# Patient Record
Sex: Female | Born: 1970 | Race: Black or African American | Hispanic: No | Marital: Single | Smoking: Former smoker
Health system: Southern US, Community
[De-identification: ages and names within clinical notes are randomized; demographics above are authoritative.]

## PROBLEM LIST (undated history)

## (undated) DIAGNOSIS — E119 Type 2 diabetes mellitus without complications: Secondary | ICD-10-CM

## (undated) DIAGNOSIS — I10 Essential (primary) hypertension: Secondary | ICD-10-CM

## (undated) HISTORY — DX: Type 2 diabetes mellitus without complications: E11.9

## (undated) HISTORY — PX: CHOLECYSTECTOMY: SHX55

## (undated) HISTORY — PX: ABDOMINAL HYSTERECTOMY: SHX81

---

## 1997-08-18 ENCOUNTER — Encounter: Admission: RE | Admit: 1997-08-18 | Discharge: 1997-11-16 | Payer: Self-pay | Admitting: *Deleted

## 1998-08-25 ENCOUNTER — Other Ambulatory Visit: Admission: RE | Admit: 1998-08-25 | Discharge: 1998-08-25 | Payer: Self-pay | Admitting: *Deleted

## 1998-12-08 ENCOUNTER — Other Ambulatory Visit: Admission: RE | Admit: 1998-12-08 | Discharge: 1998-12-08 | Payer: Self-pay | Admitting: *Deleted

## 2000-11-28 ENCOUNTER — Other Ambulatory Visit: Admission: RE | Admit: 2000-11-28 | Discharge: 2000-11-28 | Payer: Self-pay | Admitting: *Deleted

## 2001-09-05 ENCOUNTER — Encounter: Payer: Self-pay | Admitting: General Surgery

## 2001-09-05 ENCOUNTER — Encounter (INDEPENDENT_AMBULATORY_CARE_PROVIDER_SITE_OTHER): Payer: Self-pay | Admitting: Specialist

## 2001-09-05 ENCOUNTER — Observation Stay (HOSPITAL_COMMUNITY): Admission: RE | Admit: 2001-09-05 | Discharge: 2001-09-06 | Payer: Self-pay | Admitting: General Surgery

## 2002-07-24 ENCOUNTER — Other Ambulatory Visit: Admission: RE | Admit: 2002-07-24 | Discharge: 2002-07-24 | Payer: Self-pay | Admitting: *Deleted

## 2004-01-09 ENCOUNTER — Ambulatory Visit (HOSPITAL_BASED_OUTPATIENT_CLINIC_OR_DEPARTMENT_OTHER): Admission: RE | Admit: 2004-01-09 | Discharge: 2004-01-09 | Payer: Self-pay | Admitting: Internal Medicine

## 2004-02-02 ENCOUNTER — Emergency Department (HOSPITAL_COMMUNITY): Admission: EM | Admit: 2004-02-02 | Discharge: 2004-02-02 | Payer: Self-pay | Admitting: Family Medicine

## 2004-02-22 ENCOUNTER — Other Ambulatory Visit: Admission: RE | Admit: 2004-02-22 | Discharge: 2004-02-22 | Payer: Self-pay | Admitting: Obstetrics and Gynecology

## 2004-06-12 ENCOUNTER — Emergency Department (HOSPITAL_COMMUNITY): Admission: EM | Admit: 2004-06-12 | Discharge: 2004-06-12 | Payer: Self-pay | Admitting: Family Medicine

## 2005-04-06 ENCOUNTER — Encounter: Admission: RE | Admit: 2005-04-06 | Discharge: 2005-04-06 | Payer: Self-pay | Admitting: Internal Medicine

## 2006-03-01 ENCOUNTER — Emergency Department (HOSPITAL_COMMUNITY): Admission: EM | Admit: 2006-03-01 | Discharge: 2006-03-01 | Payer: Self-pay | Admitting: Emergency Medicine

## 2006-03-31 ENCOUNTER — Emergency Department (HOSPITAL_COMMUNITY): Admission: EM | Admit: 2006-03-31 | Discharge: 2006-03-31 | Payer: Self-pay | Admitting: Family Medicine

## 2006-03-31 ENCOUNTER — Emergency Department (HOSPITAL_COMMUNITY): Admission: EM | Admit: 2006-03-31 | Discharge: 2006-04-01 | Payer: Self-pay | Admitting: Emergency Medicine

## 2006-07-30 ENCOUNTER — Inpatient Hospital Stay (HOSPITAL_COMMUNITY): Admission: RE | Admit: 2006-07-30 | Discharge: 2006-08-02 | Payer: Self-pay | Admitting: Obstetrics and Gynecology

## 2006-07-30 ENCOUNTER — Encounter (INDEPENDENT_AMBULATORY_CARE_PROVIDER_SITE_OTHER): Payer: Self-pay | Admitting: Specialist

## 2009-02-02 ENCOUNTER — Inpatient Hospital Stay (HOSPITAL_COMMUNITY): Admission: RE | Admit: 2009-02-02 | Discharge: 2009-02-05 | Payer: Self-pay | Admitting: Obstetrics and Gynecology

## 2009-02-02 ENCOUNTER — Encounter (INDEPENDENT_AMBULATORY_CARE_PROVIDER_SITE_OTHER): Payer: Self-pay | Admitting: Obstetrics and Gynecology

## 2009-02-15 ENCOUNTER — Encounter: Admission: RE | Admit: 2009-02-15 | Discharge: 2009-02-15 | Payer: Self-pay | Admitting: Obstetrics and Gynecology

## 2009-03-22 ENCOUNTER — Ambulatory Visit: Payer: Self-pay | Admitting: Oncology

## 2009-03-24 LAB — IRON AND TIBC
Iron: 24 ug/dL — ABNORMAL LOW (ref 42–145)
TIBC: 376 ug/dL (ref 250–470)
UIBC: 352 ug/dL

## 2009-03-24 LAB — LACTATE DEHYDROGENASE: LDH: 143 U/L (ref 94–250)

## 2009-03-24 LAB — CBC WITH DIFFERENTIAL/PLATELET
Basophils Absolute: 0.1 10*3/uL (ref 0.0–0.1)
EOS%: 3.8 % (ref 0.0–7.0)
Eosinophils Absolute: 0.4 10*3/uL (ref 0.0–0.5)
HGB: 11.2 g/dL — ABNORMAL LOW (ref 11.6–15.9)
LYMPH%: 25.2 % (ref 14.0–49.7)
MCH: 20.7 pg — ABNORMAL LOW (ref 25.1–34.0)
MCV: 66.8 fL — ABNORMAL LOW (ref 79.5–101.0)
MONO%: 5.5 % (ref 0.0–14.0)
NEUT#: 6.3 10*3/uL (ref 1.5–6.5)
NEUT%: 64.8 % (ref 38.4–76.8)
Platelets: 258 10*3/uL (ref 145–400)

## 2009-03-24 LAB — COMPREHENSIVE METABOLIC PANEL
BUN: 9 mg/dL (ref 6–23)
CO2: 22 mEq/L (ref 19–32)
Creatinine, Ser: 0.72 mg/dL (ref 0.40–1.20)
Glucose, Bld: 100 mg/dL — ABNORMAL HIGH (ref 70–99)
Total Bilirubin: 0.4 mg/dL (ref 0.3–1.2)
Total Protein: 6.8 g/dL (ref 6.0–8.3)

## 2009-03-24 LAB — FERRITIN: Ferritin: 22 ng/mL (ref 10–291)

## 2009-06-27 ENCOUNTER — Ambulatory Visit: Payer: Self-pay | Admitting: Oncology

## 2009-06-28 ENCOUNTER — Encounter: Admission: RE | Admit: 2009-06-28 | Discharge: 2009-06-28 | Payer: Self-pay | Admitting: Internal Medicine

## 2009-10-24 ENCOUNTER — Encounter: Admission: RE | Admit: 2009-10-24 | Discharge: 2009-12-29 | Payer: Self-pay | Admitting: Internal Medicine

## 2009-12-23 ENCOUNTER — Emergency Department (HOSPITAL_COMMUNITY): Admission: EM | Admit: 2009-12-23 | Discharge: 2009-12-23 | Payer: Self-pay | Admitting: Family Medicine

## 2010-07-05 LAB — CBC
HCT: 26.8 % — ABNORMAL LOW (ref 36.0–46.0)
HCT: 29.7 % — ABNORMAL LOW (ref 36.0–46.0)
HCT: 34.8 % — ABNORMAL LOW (ref 36.0–46.0)
HCT: 37.8 % (ref 36.0–46.0)
Hemoglobin: 7.4 g/dL — ABNORMAL LOW (ref 12.0–15.0)
Hemoglobin: 9.4 g/dL — ABNORMAL LOW (ref 12.0–15.0)
MCHC: 29.8 g/dL — ABNORMAL LOW (ref 30.0–36.0)
MCHC: 30.3 g/dL (ref 30.0–36.0)
MCHC: 30.8 g/dL (ref 30.0–36.0)
MCV: 62.2 fL — ABNORMAL LOW (ref 78.0–100.0)
MCV: 62.3 fL — ABNORMAL LOW (ref 78.0–100.0)
MCV: 62.9 fL — ABNORMAL LOW (ref 78.0–100.0)
MCV: 67.5 fL — ABNORMAL LOW (ref 78.0–100.0)
Platelets: 207 10*3/uL (ref 150–400)
Platelets: 288 10*3/uL (ref 150–400)
RBC: 3.88 MIL/uL (ref 3.87–5.11)
RBC: 3.97 MIL/uL (ref 3.87–5.11)
RBC: 4.28 MIL/uL (ref 3.87–5.11)
RBC: 5.54 MIL/uL — ABNORMAL HIGH (ref 3.87–5.11)
RBC: 6.07 MIL/uL — ABNORMAL HIGH (ref 3.87–5.11)
RDW: 22.3 % — ABNORMAL HIGH (ref 11.5–15.5)
WBC: 12.4 10*3/uL — ABNORMAL HIGH (ref 4.0–10.5)
WBC: 13.3 10*3/uL — ABNORMAL HIGH (ref 4.0–10.5)

## 2010-07-05 LAB — TYPE AND SCREEN: ABO/RH(D): O POS

## 2010-07-05 LAB — BASIC METABOLIC PANEL
CO2: 24 mEq/L (ref 19–32)
CO2: 26 mEq/L (ref 19–32)
Calcium: 7.7 mg/dL — ABNORMAL LOW (ref 8.4–10.5)
Chloride: 110 mEq/L (ref 96–112)
Creatinine, Ser: 0.59 mg/dL (ref 0.4–1.2)
Creatinine, Ser: 0.96 mg/dL (ref 0.4–1.2)
GFR calc Af Amer: >60 mL/min (ref >60)
GFR calc Af Amer: >60 mL/min (ref >60)
GFR calc non Af Amer: >60 mL/min (ref >60)
Potassium: 3.7 mEq/L (ref 3.5–5.1)
Sodium: 139 mEq/L (ref 135–145)

## 2010-07-05 LAB — ABO/RH: ABO/RH(D): O POS

## 2010-07-05 LAB — HEMOGLOBIN AND HEMATOCRIT, BLOOD: Hemoglobin: 9.3 g/dL — ABNORMAL LOW (ref 12.0–15.0)

## 2010-07-05 LAB — PREPARE RBC (CROSSMATCH)

## 2010-07-05 LAB — PREGNANCY, URINE: Preg Test, Ur: NEGATIVE

## 2010-07-05 LAB — FOLLICLE STIMULATING HORMONE: FSH: 5.4 m[IU]/mL

## 2010-08-18 NOTE — Discharge Summary (Signed)
[name], Jaime Daugherty              ACCOUNT NO.:  [id number]   MEDICAL RECORD NO.:  [id number]          PATIENT TYPE:  INP   LOCATION:  9319                          FACILITY:  [facility]   PHYSICIAN:  Jaime Monday, MD        DATE OF BIRTH:  Dec 09, 1970   DATE OF ADMISSION:  07/30/2006  DATE OF DISCHARGE:  08/02/2006                               DISCHARGE SUMMARY   ADMISSION DIAGNOSES:  1. Abnormal uterine bleeding.  2. Pelvic pain.  3. Ovarian masses.   DISCHARGE DIAGNOSES:  1. Abnormal uterine bleeding.  2. Pelvic pain.  3. Ovarian masses.  4. Status post bilateral salpingectomy.  5. Postop fever.   HISTORY OF PRESENT ILLNESS:  A 40 year old African-American female, G0,  who has been followed at the office for abnormal uterine bleeding,  pelvic pain and ovarian masses.  She was doing okay with occasional  pain.  Had an ultrasound on the 25th of April and since then is having  diffuse pain that was not controlled with Vicodin or NSAIDs.  She was  going to be scheduled for hysteroscopy and laparoscopy on May 16, but  due to increased pain would like to proceed as soon as possible.   PAST MEDICAL HISTORY:  Hypertension.   PAST SURGICAL HISTORY:  Cholecystectomy.   ALLERGIES:  NO KNOWN DRUG ALLERGIES.   MEDICATIONS:  1. Lisinopril.  2. Vicodin.   SOCIAL HISTORY:  She is single.  Denies alcohol, tobacco or drug use.   FAMILY HISTORY:  Not applicable.   PHYSICAL EXAMINATION:  VITAL SIGNS:  On admission she is afebrile.  Vital signs stable.  GENERAL:  She is in moderate distress.  HEART;  Regular rate and rhythm.  LUNGS:  Clear to auscultation bilaterally.  ABDOMEN:  Soft, nondistended, nontender in bilateral lower quadrants.  EXTREMITIES:  Without edema.  PELVIC EXAM:  Difficult to palpate due to habitus and tenderness  bilaterally.   HOSPITAL COURSE:  She was admitted and underwent surgery without  difficulty.  Her hemoglobin decreased from 9.8 to 7.7 directly  postoperatively.  She had a fever immediately postoperatively and was  started on Unasyn.  There was further question if her ovarian masseswere  TOAs or Tuboovarian complexes.  Her postoperative course was otherwise  uncomplicated.  On postoperative day #3 she had remained afebrile for  greater 24 hours for at least 12 off antibiotics.  Her hemoglobin was  stable and she was discharged to home.   SURGERIES PERFORMED:  1. Hysteroscopy.  2. D&C.  3. Laparoscopy.  4. Laparotomy.  5. Removal of bilateral adnexal masses.   PLAN:  Plan to remove her staples on Daugherty.   DISCHARGE INSTRUCTIONS:  Routine discharge instructions and prescription  for Motrin and Vicodin .  She voiced understanding to all of this and  was discharged to home.      Jaime Monday, MD  Electronically Signed     JB/MEDQ  D:  08/02/2006  T:  08/02/2006  Job:  [id number]

## 2010-08-18 NOTE — Procedures (Signed)
Jaime Daugherty, Jaime Daugherty              ACCOUNT NO.:  [id number]   MEDICAL RECORD NO.:  [id number]          PATIENT TYPE:  OUT   LOCATION:  SLEEP CENTER                 FACILITY:  [facility]   PHYSICIAN:  Clinton D. Maple Hudson, M.D. DATE OF BIRTH:  1971-02-13   DATE OF STUDY:  01/09/2004                              NOCTURNAL POLYSOMNOGRAM   REFERRING PHYSICIAN:  Ralene Ok, M.D.   INDICATION FOR STUDY:  Hypersomnia with sleep apnea.   SLEEP ARCHITECTURE:  Total sleep time 418 minutes with sleep efficiency 86%,  stage I was 6%, stage II 62%, stages III and IV 13%, REM was 19% of total  sleep time, sleep latency was 12 minutes, REM latency 111 minutes, awake  after sleep onset 54 minutes, arousal index 19.   RESPIRATORY DATA:  Split-study protocol.  RDI 61.9/hr indicating severe  obstructive sleep apnea/hypopnea syndrome before CPAP.  This included 16  obstructive apneas and 117 hypopneas before CPAP.  Events were not  positional.  REM RDI was 15.2.  CPAP was titrated to 11 CWP, RDI 0/hr using  a small ResMed Ultra Mirage Nasal/Oral CPAP Mask.   OXYGEN DATA:  Moderate to loud snoring with oxygen desaturation to a nadir  of 76% before CPAP control.  After CPAP titration saturation held 96% on  room air.   CARDIAC DATA:  Normal sinus rhythm.   MOVEMENT/PARASOMNIA:  Twenty two limb jerks were recorded of which 6 were  associated with arousal or awakening for a periodic limb movement with  arousal index of 0.9/hr which is unlikely to be significant.   IMPRESSION/RECOMMENDATION:  Severe obstructive sleep apnea/hypopnea  syndrome, respiratory disturbance index 61.9/hr with desaturation to 76%.  Successful continuous positive airway pressure titration to 11 CWP,  respiratory disturbance index 0/hr using a small ResMed Ultra Mirage  Nasal/Oral Mask.  A heated humidifier is suggested.                                                           Clinton D. Maple Hudson, M.D.  Diplomate, [employer]   CDY/MEDQ  D:  01/23/2004 08:39:29  T:  01/23/2004 19:22:52  Job:  [id number]

## 2010-08-18 NOTE — Op Note (Signed)
[facility]  Patient:    Jaime, Daugherty Visit Number: [id number] MRN: [id number]          Service Type: SUR Location: 3W 0381 01 Attending Physician:  Henrene Dodge Dictated by:   Anselm Pancoast. Zachery Dakins, M.D. Proc. Date: 09/05/01 Admit Date:  09/05/2001 Discharge Date: 09/06/2001                             Operative Report  PREOPERATIVE DIAGNOSIS:  Chronic cholecystitis with stones.  POSTOPERATIVE DIAGNOSIS:  Chronic cholecystitis with stones.  OPERATION PERFORMED:  Laparoscopic cholecystectomy with cholangiogram.  ANESTHESIA:  General.  SURGEON:  Anselm Pancoast. Zachery Dakins, M.D.  ASSISTANT:  Rose Phi. Maple Hudson, M.D.  HISTORY OF PRESENT ILLNESS:  Jaime Daugherty is a 40 year old overweight black female who presented to our office after being referred for episodes of epigastric pain. The patient was not acutely ill when I saw her but she has had some fairly severe attacks and last Saturday night was the most intense, this occurring after eating fish earlier in the evening. I saw her, she had been seen in the emergency room where they had ordered an x-ray that showed a 2 cm stone in the gallbladder. She lives with her parents and is admitted at this time for elective cholecystectomy. She states she has not had severe pain since I saw her two days earlier.  DESCRIPTION OF PROCEDURE:  The patient preoperatively was given 3 gm of Unasyn and PAS stockings and was taken to the operative suite. Induction of general anesthesia, endotracheal tube, oral tube into the stomach and then the abdomen was prepped with Betadine surgical scrub and solution and draped in a sterile manner. The patients heavy and approximately a small 2 cm incision was made down through approximately 3 inches of adipose tissue, identifying the fascia at the lower umbilicus. This was then picked up between two Kochers and a small opening made. The underlying peritoneum was  identified and then carefully entered through it with a Kelly bluntly. Traction sutures of #0 Vicryl were placed and the Hasson cannula introduced. The upper abdomen showed a large area. The gallbladder was up under the liver but the upper 10 mm trocar was placed and the two lateral 5 mm trocars were placed in the appropriate position after anesthetizing the fascia by Dr. Francina Ames. The gallbladder was retracted upward and outward. It was reasonably tense, not acutely inflamed and then we went into the proximal gallbladder and sort of peeled the peritoneum down and there was an anterior branch of the cystic artery that was kind of peeled off. This was doubly clipped proximally, singly distally and divided. Then we kept kind of stripping this down more proximal getting to where the stone was impacted in the neck of the gallbladder. I was able to milk the stone back and then could kind of encompass the cystic duct junction of the gallbladder and then a clip was placed across at this point. A small opening made and the [facility] catheter was placed, held in place with a clip and then an x-ray was obtained that showed good flow into the extrahepatic biliary system with a very small common bile duct apparently a long cystic duct that I am sure part of its intramural. The catheter was removed and the cystic duct was triply clipped and then divided and the posterior branch of the cystic artery was likewise identified, doubly clipped proximally, singly distally divided and  then the gallbladder carefully removed from its bed with the hook electrocautery. The gallbladder was intact and then was brought up through the camera and the upper 10 mm port and withdrawn through the fascial defect. The fascia did not have to be enlarged to get this stone through and then the fascia was closed, the two sutures previously placed were tied and interrupted figure-of-eight was placed in the midline of #0 Vicryl also.  The wounds were thoroughly irrigated and then closed subcuticularly with 4-0 Vicryl, Benzoin and Steri-Strips on the skin. We removed some of the irrigating fluid with no evidence of any bleeding and we released the carbon dioxide before withdrawing the trocars. The patient will spend the night and she will be ready to be discharged in the a.m. and hopefully will have no further problems of abdominal pain. Dictated by:   Anselm Pancoast. Zachery Dakins, M.D. Attending Physician:  Henrene Dodge DD:  09/05/01 TD:  09/08/01 Job: [id number]

## 2010-08-18 NOTE — Op Note (Signed)
Jaime Daugherty, Jaime Daugherty              ACCOUNT NO.:  [id number]   MEDICAL RECORD NO.:  [id number]          PATIENT TYPE:  INP   LOCATION:  9319                          FACILITY:  [facility]   PHYSICIAN:  Zenaida Niece, M.D.DATE OF BIRTH:  Dec 24, 1970   DATE OF PROCEDURE:  07/30/2006  DATE OF DISCHARGE:                               OPERATIVE REPORT   PREOPERATIVE DIAGNOSIS:  Pelvic pain, adnexal masses, and abnormal  uterine bleeding.   POSTOPERATIVE DIAGNOSIS:  Pelvic pain, adnexal masses, and abnormal  uterine bleeding.   PROCEDURES:  Hysteroscopy with D&C followed by a laparoscopy, followed  by laparotomy with removal of bilateral adnexal masses.   SURGEON:  Zenaida Niece, M.D.   ANESTHESIA:  General endotracheal tube.   SPECIMENS:  Endometrial curettings and bilateral adnexal masses sent to  pathology.   ESTIMATED BLOOD LOSS:  1000 mL.   COMPLICATIONS:  The large adnexal masses with significant adhesions to  the back of the uterus and the pelvic sidewalls.   FINDINGS:  On hysteroscopy I did not get good visualization but did not  see any significant polyps or fibroids.  Curettage revealed minimal  tissue.  On laparoscopy, she had cystic gelatinous material filling the  pelvis obscuring view of normal tubes and ovaries and uterus.  On  laparotomy, she had large bilateral adnexal masses which were  significantly adherent to the back of the uterus and the pelvic  sidewalls.  By cystoscopy, she had a normal bladder with patent ureters.   PROCEDURE IN DETAIL:  The patient was taken to the operating room and  placed in the dorsosupine position.  General anesthesia was induced and  she was placed in mobile stirrups.  Abdomen, perineum and vagina were  then prepped and draped in the usual sterile fashion.  A Graves speculum  was inserted into the vagina and the anterior lip of the cervix was  grasped with a single-tooth tenaculum.  Paracervical block was then  performed with 16  mL of 2% plain lidocaine.  The uterus sounded to  approximately 9 cm.  Cervix was gradually dilated to a size 19 dilator  and the observer hysteroscope was inserted without difficulty.  The  uterus was tilted significantly anterior and was difficult to get good  visualization.  I did not notice any significant polyps or submucosal  fibroids.  Sharp curettage was performed and revealed minimal tissue.  Hysteroscopy was performed again and again no significant lesions were  seen although visualization was suboptimal.  The hysteroscope was  removed.  A Zumi uterine manipulator was then introduced for  manipulation and possible chromopertubation.  The bladder was also  drained with a Foley catheter.  The speculum was removed from the  vagina.  The scrub tech and myself changed gloves and we turned to the  abdomen.  A three-quarter cm vertical incision was made in her umbilicus  at her previous scar.  This was done after this area was infiltrated  with quarter percent Marcaine.  Veress needle was inserted into the  peritoneal cavity and placement confirmed by water drop test and opening  pressure  6 mmHg.  CO2 gas was inflated to a pressure of 12 mmHg and the  Veress needle was removed.  A 5 mm trocar was then introduced with  direct visualization with the laparoscope.  A 5 mm trocar was then also  placed on the left side under direct visualization.  Inspection revealed  the above-mentioned findings.  The upper abdomen appeared normal.  The  appendix was retrocecal and was not well visualized.  I was unable to  significantly visualize any pelvic anatomy due to the large gelatinous  cystic structures that were significantly adherent.  The trocars were  removed and all gas allowed to deflate from the abdomen.   Attention was turned to laparotomy.  The abdomen was then entered via a  standard Pfannenstiel's incision and a self-retaining retractor was  placed.  Small bowel was packed out of the  incision.  The attention was  first turned to the right adnexal mass.  Bluntly this was dissected from  the colon to free up the colon.  This was then bluntly freed up from the  pelvic sidewall with significant difficulty.  I was able to free this  from the ovary clamping with a Heaney clamp and removing the pedicle and  suturing the uterine side of the pedicle.  Then using electrocautery I  was able to free the tube and ovary up to the level of the  infundibulopelvic ligament and this was clamped and transected and  ligated with 0 Vicryl.  The specimen was sent as right adnexal mass.  I  am pretty sure that this was the right tube and ovary.  There was  significant adhesions and edema of the tissue that made it very  difficult to identify what was tube and ovary.  Bleeding from the pelvic  sidewall and the top of the uterus was controlled with electrocautery  and 3-0 Vicryl.  Inspection then revealed a similar mass on the left  side.  In a similar manner using blunt and sharp dissection with  electrocautery this mass was freed up.  Again I started at the utero-  ovarian pedicle and this was clamped, transected and ligated with 0  Vicryl.  Using electrocautery this was freed up to the level of the  infundibulopelvic ligament.  This was then clamped, transected and  ligated with 0 Vicryl.  All pedicles were inspected and the pelvis was  copiously irrigated.  No further masses were identified.  There is still  some significant adhesions in the posterior pelvis but I was unable to  adequately visualize this.  Bleeding from the uterine fundus was  controlled with electrocautery and 3-0 Vicryl.  All pedicles were then  found to be hemostatic.  I was able to palpate the left ureter but not  the right ureter.  All packs were removed from the pelvis.  The  subfascial space was irrigated and made hemostatic with electrocautery. Fascia was closed in running fashion starting at both ends and  meeting  in the middle with 0 Vicryl.  Subcutaneous tissue was irrigated and made  hemostatic with electrocautery.  Subcutaneous tissue was closed with  running 2-0 plain gut suture.  Skin was closed with staples.   Attention was then turned to cystoscopy after the laparoscopic incisions  were closed with Dermabond.  The Foley catheter was removed as well as  the [employer].  A 70 degrees cystoscope was inserted into the  bladder and the bladder filled with sterile solution.  The bladder  appeared normal and indigo carmine was seen to flow freely from each  ureteral orifice.  The cystoscope was removed and the Foley catheter was  replaced.  The patient was then taken down from stirrups.  She was  awakened in the operating room and taken to the recovery room in stable  condition.  Counts were correct and she received Ancef 1 gram IV during  the procedure once laparotomy was made.  She had PAS hose on throughout  the procedure.  Due to her blood loss she had a type and cross performed  and I will check this hemoglobin shortly after surgery to see if she  requires transfusion.      Zenaida Niece, M.D.  Electronically Signed     TDM/MEDQ  D:  07/30/2006  T:  07/31/2006  Job:  [id number]

## 2011-01-16 ENCOUNTER — Inpatient Hospital Stay (INDEPENDENT_AMBULATORY_CARE_PROVIDER_SITE_OTHER)
Admission: RE | Admit: 2011-01-16 | Discharge: 2011-01-16 | Disposition: A | Discharge status: Home / Self Care | Payer: BC Managed Care – PPO | Source: Ambulatory Visit | Attending: Emergency Medicine | Admitting: Emergency Medicine

## 2011-01-16 DIAGNOSIS — S335XXA Sprain of ligaments of lumbar spine, initial encounter: Secondary | ICD-10-CM

## 2011-01-16 LAB — POCT URINALYSIS DIP (DEVICE)
Ketones, ur: NEGATIVE mg/dL
Leukocytes, UA: NEGATIVE
Protein, ur: 30 mg/dL — AB
Urobilinogen, UA: 0.2 mg/dL (ref 0.0–1.0)
pH: 7 (ref 5.0–8.0)

## 2011-05-09 ENCOUNTER — Ambulatory Visit
Admission: RE | Admit: 2011-05-09 | Discharge: 2011-05-09 | Disposition: A | Discharge status: Home / Self Care | Payer: BC Managed Care – PPO | Source: Ambulatory Visit | Attending: Internal Medicine | Admitting: Internal Medicine

## 2011-05-09 ENCOUNTER — Other Ambulatory Visit: Payer: Self-pay | Admitting: Internal Medicine

## 2011-05-09 DIAGNOSIS — M545 Low back pain, unspecified: Secondary | ICD-10-CM

## 2011-06-04 ENCOUNTER — Other Ambulatory Visit: Payer: Self-pay | Admitting: Obstetrics and Gynecology

## 2011-06-04 DIAGNOSIS — Z1231 Encounter for screening mammogram for malignant neoplasm of breast: Secondary | ICD-10-CM

## 2011-06-12 ENCOUNTER — Ambulatory Visit
Admission: RE | Admit: 2011-06-12 | Discharge: 2011-06-12 | Disposition: A | Discharge status: Home / Self Care | Payer: BC Managed Care – PPO | Source: Ambulatory Visit | Attending: Obstetrics and Gynecology | Admitting: Obstetrics and Gynecology

## 2011-06-12 DIAGNOSIS — Z1231 Encounter for screening mammogram for malignant neoplasm of breast: Secondary | ICD-10-CM

## 2011-06-25 ENCOUNTER — Ambulatory Visit (INDEPENDENT_AMBULATORY_CARE_PROVIDER_SITE_OTHER): Payer: BC Managed Care – PPO | Admitting: Family Medicine

## 2011-06-25 VITALS — BP 123/80 | HR 88 | Temp 98.6°F | Resp 18 | Ht 65.0 in | Wt 284.0 lb

## 2011-06-25 DIAGNOSIS — R5383 Other fatigue: Secondary | ICD-10-CM

## 2011-06-25 DIAGNOSIS — I10 Essential (primary) hypertension: Secondary | ICD-10-CM

## 2011-06-25 DIAGNOSIS — J111 Influenza due to unidentified influenza virus with other respiratory manifestations: Secondary | ICD-10-CM

## 2011-06-25 DIAGNOSIS — R6883 Chills (without fever): Secondary | ICD-10-CM

## 2011-06-25 LAB — POCT INFLUENZA A/B: Influenza A, POC: POSITIVE

## 2011-06-25 MED ORDER — HYDROCODONE-HOMATROPINE 5-1.5 MG/5ML PO SYRP: 5.0000 mL | ORAL_SOLUTION | Freq: Three times a day (TID) | ORAL | Status: AC | PRN

## 2011-06-25 MED ORDER — OSELTAMIVIR PHOSPHATE 75 MG PO CAPS: ORAL_CAPSULE | ORAL | Status: DC

## 2011-06-25 NOTE — Progress Notes (Signed)
  Patient Name: Jaime Daugherty Date of Birth: 21-Nov-1970 Medical Record Number: [id number] Gender: female Date of Encounter: 06/25/2011  History of Present Illness:  Jaime Daugherty is a 41 y.o. very pleasant female patient who presents with the following:  Here today with illness- chills, feels weak and tired.  Some cough, no ST.  Unsure if she has had a fever.  Eyes are watering.  2 weeks ago she had diarrhea but this has resolved.  Some sick contacts at church- her symptoms really started yesterday after she attended church.   There is no problem list on file for this patient.  No past medical history on file. No past surgical history on file. History  Substance Use Topics  . Smoking status: Former Games developer  . Smokeless tobacco: Not on file  . Alcohol Use: Not on file   No family history on file. No Known Allergies  Medication list has been reviewed and updated.  Review of Systems: As per HPI- otherwise negative.   Physical Examination: Filed Vitals:   06/25/11 1147  BP: 123/80  Pulse: 88  Temp: 98.6 F (37 C)  TempSrc: Oral  Resp: 18  Height: 5\' 5"  (1.651 m)  Weight: 284 lb (128.822 kg)    Body mass index is 47.26 kg/(m^2).  GEN: WDWN, NAD, Non-toxic, A & O x 3, morbid obesity HEENT: Atraumatic, Normocephalic. Neck supple. No masses, No LAD.  TM, oropharynx wnl Ears and Nose: No external deformity. CV: RRR, No M/G/R. No JVD. No thrill. No extra heart sounds. PULM: CTA B, no wheezes, crackles, rhonchi. No retractions. No resp. distress. No accessory muscle use. ABD: S, NT, ND. EXTR: No c/c/e NEURO Normal gait.  PSYCH: Normally interactive. Conversant. Not depressed or anxious appearing.  Calm demeanor.    Results for orders placed in visit on 06/25/11  POCT INFLUENZA A/B      Component Value Range   Influenza A, POC Positive     Influenza B, POC Negative     Assessment and Plan: 1. Chills  POCT Influenza A/B  2. HTN (hypertension)    3. Fatigue      4. Influenza  oseltamivir (TAMIFLU) 75 MG capsule, HYDROcodone-homatropine (HYCODAN) 5-1.5 MG/5ML syrup    Rest, fluids, note for work- may be out part or all of this week.  Patient (or parent if minor) instructed to return to clinic or call if not better in 3-4 day(s). Sooner if worse.

## 2011-06-29 ENCOUNTER — Telehealth: Payer: Self-pay

## 2011-06-29 NOTE — Telephone Encounter (Signed)
Left message on machine to call back  

## 2011-06-29 NOTE — Telephone Encounter (Signed)
Pt was out of work with the flu from monday 06/25/11-today 06/29/11, pt would like an out of work note for these days because the note written stated that she could be out part or all of this week she just needs something with the actual dates.

## 2011-07-05 ENCOUNTER — Other Ambulatory Visit: Payer: Self-pay | Admitting: Neurosurgery

## 2011-07-05 DIAGNOSIS — IMO0002 Reserved for concepts with insufficient information to code with codable children: Secondary | ICD-10-CM

## 2011-07-05 DIAGNOSIS — M545 Low back pain, unspecified: Secondary | ICD-10-CM

## 2011-07-05 DIAGNOSIS — M47817 Spondylosis without myelopathy or radiculopathy, lumbosacral region: Secondary | ICD-10-CM

## 2011-07-05 DIAGNOSIS — M5137 Other intervertebral disc degeneration, lumbosacral region: Secondary | ICD-10-CM

## 2011-12-02 ENCOUNTER — Encounter (HOSPITAL_COMMUNITY): Payer: Self-pay

## 2011-12-02 ENCOUNTER — Emergency Department (HOSPITAL_COMMUNITY)
Admission: EM | Admit: 2011-12-02 | Discharge: 2011-12-02 | Disposition: A | Discharge status: Home / Self Care | Payer: BC Managed Care – PPO | Source: Home / Self Care | Attending: Family Medicine | Admitting: Family Medicine

## 2011-12-02 DIAGNOSIS — M778 Other enthesopathies, not elsewhere classified: Secondary | ICD-10-CM

## 2011-12-02 DIAGNOSIS — M779 Enthesopathy, unspecified: Secondary | ICD-10-CM

## 2011-12-02 HISTORY — DX: Essential (primary) hypertension: I10

## 2011-12-02 MED ORDER — TRAMADOL HCL 50 MG PO TABS: 50.0000 mg | ORAL_TABLET | Freq: Four times a day (QID) | ORAL | Status: AC | PRN

## 2011-12-02 MED ORDER — NAPROXEN 500 MG PO TABS: 500.0000 mg | ORAL_TABLET | Freq: Two times a day (BID) | ORAL | Status: AC

## 2011-12-02 NOTE — ED Notes (Signed)
Pt has left middle finger pain for one month, no known injury and is able to bend finger

## 2011-12-02 NOTE — ED Provider Notes (Signed)
History     CSN: [id number]  Arrival date & time 12/02/11  1042   First MD Initiated Contact with Patient 12/02/11 1043      Chief Complaint  Patient presents with  . Hand Pain    (Consider location/radiation/quality/duration/timing/severity/associated sxs/prior treatment) HPI Comments: 41 year old female left-handed. Here complaining of pain in the left middle finger for about 2 months. 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 Denies redness or swelling. No drainage. No falls or direct trauma affected the tender area. No other joints involved. No similar symptoms in the past.   Past Medical History  Diagnosis Date  . Hypertension     History reviewed. No pertinent past surgical history.  History reviewed. No pertinent family history.  History  Substance Use Topics  . Smoking status: Never Smoker   . Smokeless tobacco: Not on file  . Alcohol Use: No    OB History    Grav Para Term Preterm Abortions TAB SAB Ect Mult Living                  Review of Systems  Constitutional: Negative for fever and chills.  Musculoskeletal: Negative for joint swelling.       As per HPI  Skin: Negative for rash and wound.  All other systems reviewed and are negative.    Allergies  Review of patient's allergies indicates no known allergies.  Home Medications   Current Outpatient Rx  Name Route Sig Dispense Refill  . FUROSEMIDE 20 MG PO TABS Oral Take 20 mg by mouth 2 (two) times daily.    Marland Kitchen LISINOPRIL-HYDROCHLOROTHIAZIDE 10-12.5 MG PO TABS Oral Take 1 tablet by mouth daily.    Marland Kitchen NAPROXEN 500 MG PO TABS Oral Take 1 tablet (500 mg total) by mouth 2 (two) times daily with a meal. 20 tablet 0  . OSELTAMIVIR PHOSPHATE 75 MG PO CAPS  Take one BID for 5 days 10 capsule 0  . TRAMADOL HCL 50 MG PO TABS Oral Take 1 tablet (50 mg total) by mouth every 6 (six) hours as needed for pain. 15  tablet 0    BP 149/86  Pulse 93  Temp 98.9 F (37.2 C) (Oral)  Resp 16  SpO2 96%  Physical Exam  Nursing note and vitals reviewed. Constitutional: She is oriented to person, place, and time. She appears well-developed and well-nourished. No distress.       obese  HENT:  Head: Normocephalic and atraumatic.  Eyes: Conjunctivae are normal. Pupils are equal, round, and reactive to light.  Neck: Neck supple.  Cardiovascular: Normal rate, regular rhythm and normal heart sounds.   Pulmonary/Chest: Effort normal and breath sounds normal.  Musculoskeletal:       Left middle finger: no deformity, no swelling or erythema. There is tenderness to palpation dorsally at the level of PIP joint patient able to flex and extend PIP and DIP passively and actively against resistance. Reported pain in same area with PIP joint flexion. Entire left hand with normal perfusion and grossly neurologically intact.    Neurological: She is alert and oriented to person, place, and time.  Skin: No rash noted.    ED Course  Procedures (including critical care time)  Labs Reviewed - No data to display No results found.   1. Finger tendinitis       MDM  Treated with a static finger splint. Tramadol and naproxen.  Rehabilitation exercises discussed with patient. Asked to followup with orthopedic specialist if persistent or worsening symptoms after completing treatment.        Sharin Grave, MD 12/04/11 0730

## 2012-05-27 ENCOUNTER — Other Ambulatory Visit: Payer: Self-pay | Admitting: Obstetrics and Gynecology

## 2012-05-27 DIAGNOSIS — Z1231 Encounter for screening mammogram for malignant neoplasm of breast: Secondary | ICD-10-CM

## 2012-07-01 ENCOUNTER — Ambulatory Visit
Admission: RE | Admit: 2012-07-01 | Discharge: 2012-07-01 | Disposition: A | Discharge status: Home / Self Care | Payer: BC Managed Care – PPO | Source: Ambulatory Visit | Attending: Obstetrics and Gynecology | Admitting: Obstetrics and Gynecology

## 2012-07-01 DIAGNOSIS — Z1231 Encounter for screening mammogram for malignant neoplasm of breast: Secondary | ICD-10-CM

## 2013-05-10 ENCOUNTER — Encounter (HOSPITAL_COMMUNITY): Payer: Self-pay | Admitting: Emergency Medicine

## 2013-05-10 ENCOUNTER — Emergency Department (HOSPITAL_COMMUNITY)
Admission: EM | Admit: 2013-05-10 | Discharge: 2013-05-10 | Disposition: A | Discharge status: Home / Self Care | Payer: BC Managed Care – PPO | Source: Home / Self Care | Attending: Emergency Medicine | Admitting: Emergency Medicine

## 2013-05-10 DIAGNOSIS — B9789 Other viral agents as the cause of diseases classified elsewhere: Principal | ICD-10-CM

## 2013-05-10 DIAGNOSIS — J069 Acute upper respiratory infection, unspecified: Secondary | ICD-10-CM

## 2013-05-10 LAB — GLUCOSE, CAPILLARY: Glucose-Capillary: 185 mg/dL — ABNORMAL HIGH (ref 70–99)

## 2013-05-10 MED ORDER — ALBUTEROL SULFATE HFA 108 (90 BASE) MCG/ACT IN AERS: 1.0000 | INHALATION_SPRAY | Freq: Four times a day (QID) | RESPIRATORY_TRACT | Status: DC | PRN

## 2013-05-10 MED ORDER — IPRATROPIUM-ALBUTEROL 0.5-2.5 (3) MG/3ML IN SOLN
3.0000 mL | Freq: Once | RESPIRATORY_TRACT | Status: AC
  Administered 2013-05-10: 3 mL via RESPIRATORY_TRACT

## 2013-05-10 MED ORDER — IPRATROPIUM-ALBUTEROL 0.5-2.5 (3) MG/3ML IN SOLN
RESPIRATORY_TRACT | Status: AC
  Filled 2013-05-10: qty 3

## 2013-05-10 MED ORDER — HYDROCOD POLST-CHLORPHEN POLST 10-8 MG/5ML PO LQCR: 5.0000 mL | Freq: Two times a day (BID) | ORAL | Status: DC | PRN

## 2013-05-10 NOTE — ED Provider Notes (Signed)
CSN: [id number]     Arrival date & time 05/10/13  0902 History   First MD Initiated Contact with Patient 05/10/13 [phone]     Chief Complaint  Patient presents with  . URI   (Consider location/radiation/quality/duration/timing/severity/associated sxs/prior Treatment) HPI Comments: 43 year old female presents complaining of Ears itching, sinus pressure, nasal congestion, sore throat, some coughing.  Started Friday with chills, sneezing.  nyquil and cough drops, not really helping.  No sick contacts or recent travel. No chest pain or shortness of breath. Gradual onset. She also notes that she is supposed to be taking metformin for her diabetes but she has not taken it in a few days.  Patient is a 43 y.o. female presenting with URI.  URI Presenting symptoms: cough, ear pain (itching, not pain), fatigue, rhinorrhea and sore throat   Presenting symptoms: no fever   Associated symptoms: sneezing   Associated symptoms: no arthralgias, no myalgias and no wheezing     Past Medical History  Diagnosis Date  . Hypertension    History reviewed. No pertinent past surgical history. History reviewed. No pertinent family history. History  Substance Use Topics  . Smoking status: Never Smoker   . Smokeless tobacco: Not on file  . Alcohol Use: No   OB History   Grav Para Term Preterm Abortions TAB SAB Ect Mult Living                 Review of Systems  Constitutional: Positive for chills and fatigue. Negative for fever.  HENT: Positive for ear pain (itching, not pain), rhinorrhea, sinus pressure, sneezing and sore throat.   Eyes: Negative for visual disturbance.  Respiratory: Positive for cough. Negative for chest tightness, shortness of breath and wheezing.   Cardiovascular: Negative for chest pain, palpitations and leg swelling.  Gastrointestinal: Negative for nausea, vomiting and abdominal pain.  Endocrine: Negative for polydipsia and polyuria.  Genitourinary: Negative for dysuria, urgency and  frequency.  Musculoskeletal: Negative for arthralgias and myalgias.  Skin: Negative for rash.  Neurological: Negative for dizziness, weakness and light-headedness.    Allergies  Review of patient's allergies indicates no known allergies.  Home Medications   Current Outpatient Rx  Name  Route  Sig  Dispense  Refill  . albuterol (PROVENTIL HFA;VENTOLIN HFA) 108 (90 BASE) MCG/ACT inhaler   Inhalation   Inhale 1-2 puffs into the lungs every 6 (six) hours as needed for wheezing or shortness of breath.   1 Inhaler   0   . chlorpheniramine-HYDROcodone (TUSSIONEX PENNKINETIC ER) 10-8 MG/5ML LQCR   Oral   Take 5 mLs by mouth every 12 (twelve) hours as needed for cough.   115 mL   0   . furosemide (LASIX) 20 MG tablet   Oral   Take 20 mg by mouth 2 (two) times daily.         Marland Kitchen lisinopril-hydrochlorothiazide (PRINZIDE,ZESTORETIC) 10-12.5 MG per tablet   Oral   Take 1 tablet by mouth daily.         Marland Kitchen oseltamivir (TAMIFLU) 75 MG capsule      Take one BID for 5 days   10 capsule   0    BP 107/71  Pulse 92  Temp(Src) 98.2 F (36.8 C) (Oral)  Resp 18  SpO2 97% Physical Exam  Nursing note and vitals reviewed. Constitutional: She is oriented to person, place, and time. Vital signs are normal. She appears well-developed and well-nourished. No distress.  HENT:  Head: Normocephalic and atraumatic.  Right Ear: External ear  normal.  Left Ear: External ear normal.  Nose: Nose normal.  Mouth/Throat: Oropharynx is clear and moist. No oropharyngeal exudate.  Eyes: Conjunctivae are normal. Right eye exhibits no discharge. Left eye exhibits no discharge.  Neck: Normal range of motion. Neck supple.  Cardiovascular: Normal rate, regular rhythm and normal heart sounds.  Exam reveals no gallop and no friction rub.   No murmur heard. Pulmonary/Chest: Effort normal. No respiratory distress. She has wheezes (scattered, faint). She has no rales.  Abdominal: Soft. There is no tenderness.   Lymphadenopathy:    She has no cervical adenopathy.  Neurological: She is alert and oriented to person, place, and time. She has normal strength. Coordination normal.  Skin: Skin is warm and dry. No rash noted. She is not diaphoretic.  Psychiatric: She has a normal mood and affect. Judgment normal.    ED Course  Procedures (including critical care time) Labs Review Labs Reviewed  GLUCOSE, CAPILLARY - Abnormal; Notable for the following:    Glucose-Capillary 185 (*)    All other components within normal limits   Imaging Review No results found.   significant improvement symptomatically and to auscultation with DuoNeb  MDM   1. Viral URI with cough    We'll treat with albuterol and Tussionex. Encouraged to take her metformin, she says she has had nausea which is why she doesn't take it, suggested starting at a lower dose and working up. She will followup with her primary care  Meds ordered this encounter  Medications  . ipratropium-albuterol (DUONEB) 0.5-2.5 (3) MG/3ML nebulizer solution 3 mL    Sig:   . albuterol (PROVENTIL HFA;VENTOLIN HFA) 108 (90 BASE) MCG/ACT inhaler    Sig: Inhale 1-2 puffs into the lungs every 6 (six) hours as needed for wheezing or shortness of breath.    Dispense:  1 Inhaler    Refill:  0    Order Specific Question:  Supervising Provider    Answer:  Clementeen GrahamOREY, EVAN, S [id number]]  . chlorpheniramine-HYDROcodone (TUSSIONEX PENNKINETIC ER) 10-8 MG/5ML LQCR    Sig: Take 5 mLs by mouth every 12 (twelve) hours as needed for cough.    Dispense:  115 mL    Refill:  0    Order Specific Question:  Supervising Provider    Answer:  Clementeen GrahamOREY, EVAN, Kathie RhodesS [[id number]]       Graylon GoodZachary H Clover Feehan, PA-C 05/10/13 [phone]

## 2013-05-10 NOTE — ED Notes (Addendum)
C/o congestion, cough, ears itching, and head pressure OTC medication used but no relief.   7/10 on pain scale Diabetic but has not been taking medications or check sugar

## 2013-05-10 NOTE — Discharge Instructions (Signed)
Antibiotic Nonuse ° Your caregiver felt that the infection or problem was not one that would be helped with an antibiotic. °Infections may be caused by viruses or bacteria. Only a caregiver can tell which one of these is the likely cause of an illness. A cold is the most common cause of infection in both adults and children. A cold is a virus. Antibiotic treatment will have no effect on a viral infection. Viruses can lead to many lost days of work caring for sick children and many missed days of school. Children may catch as many as 10 "colds" or "flus" per year during which they can be tearful, cranky, and uncomfortable. The goal of treating a virus is aimed at keeping the ill person comfortable. °Antibiotics are medications used to help the body fight bacterial infections. There are relatively few types of bacteria that cause infections but there are hundreds of viruses. While both viruses and bacteria cause infection they are very different types of germs. A viral infection will typically go away by itself within 7 to 10 days. Bacterial infections may spread or get worse without antibiotic treatment. °Examples of bacterial infections are: °· Sore throats (like strep throat or tonsillitis). °· Infection in the lung (pneumonia). °· Ear and skin infections. °Examples of viral infections are: °· Colds or flus. °· Most coughs and bronchitis. °· Sore throats not caused by Strep. °· Runny noses. °It is often best not to take an antibiotic when a viral infection is the cause of the problem. Antibiotics can kill off the helpful bacteria that we have inside our body and allow harmful bacteria to start growing. Antibiotics can cause side effects such as allergies, nausea, and diarrhea without helping to improve the symptoms of the viral infection. Additionally, repeated uses of antibiotics can cause bacteria inside of our body to become resistant. That resistance can be passed onto harmful bacterial. The next time you have  an infection it may be harder to treat if antibiotics are used when they are not needed. Not treating with antibiotics allows our own immune system to develop and take care of infections more efficiently. Also, antibiotics will work better for us when they are prescribed for bacterial infections. °Treatments for a child that is ill may include: °· Give extra fluids throughout the day to stay hydrated. °· Get plenty of rest. °· Only give your child over-the-counter or prescription medicines for pain, discomfort, or fever as directed by your caregiver. °· The use of a cool mist humidifier may help stuffy noses. °· Cold medications if suggested by your caregiver. °Your caregiver may decide to start you on an antibiotic if: °· The problem you were seen for today continues for a longer length of time than expected. °· You develop a secondary bacterial infection. °SEEK MEDICAL CARE IF: °· Fever lasts longer than 5 days. °· Symptoms continue to get worse after 5 to 7 days or become severe. °· Difficulty in breathing develops. °· Signs of dehydration develop (poor drinking, rare urinating, dark colored urine). °· Changes in behavior or worsening tiredness (listlessness or lethargy). °Document Released: 05/28/2001 Document Revised: 06/11/2011 Document Reviewed: 11/24/2008 °ExitCare® Patient Information ©2014 [employer]. ° °Upper Respiratory Infection, Adult °An upper respiratory infection (URI) is also sometimes known as the common cold. The upper respiratory tract includes the nose, sinuses, throat, trachea, and bronchi. Bronchi are the airways leading to the lungs. Most people improve within 1 week, but symptoms can last up to 2 weeks. A residual cough may   last even longer.  °CAUSES °Many different viruses can infect the tissues lining the upper respiratory tract. The tissues become irritated and inflamed and often become very moist. Mucus production is also common. A cold is contagious. You can easily spread the virus  to others by oral contact. This includes kissing, sharing a glass, coughing, or sneezing. Touching your mouth or nose and then touching a surface, which is then touched by another person, can also spread the virus. °SYMPTOMS  °Symptoms typically develop 1 to 3 days after you come in contact with a cold virus. Symptoms vary from person to person. They may include: °· Runny nose. °· Sneezing. °· Nasal congestion. °· Sinus irritation. °· Sore throat. °· Loss of voice (laryngitis). °· Cough. °· Fatigue. °· Muscle aches. °· Loss of appetite. °· Headache. °· Low-grade fever. °DIAGNOSIS  °You might diagnose your own cold based on familiar symptoms, since most people get a cold 2 to 3 times a year. Your caregiver can confirm this based on your exam. Most importantly, your caregiver can check that your symptoms are not due to another disease such as strep throat, sinusitis, pneumonia, asthma, or epiglottitis. Blood tests, throat tests, and X-rays are not necessary to diagnose a common cold, but they may sometimes be helpful in excluding other more serious diseases. Your caregiver will decide if any further tests are required. °RISKS AND COMPLICATIONS  °You may be at risk for a more severe case of the common cold if you smoke cigarettes, have chronic heart disease (such as heart failure) or lung disease (such as asthma), or if you have a weakened immune system. The very young and very old are also at risk for more serious infections. Bacterial sinusitis, middle ear infections, and bacterial pneumonia can complicate the common cold. The common cold can worsen asthma and chronic obstructive pulmonary disease (COPD). Sometimes, these complications can require emergency medical care and may be life-threatening. °PREVENTION  °The best way to protect against getting a cold is to practice good hygiene. Avoid oral or hand contact with people with cold symptoms. Wash your hands often if contact occurs. There is no clear evidence that  vitamin C, vitamin E, echinacea, or exercise reduces the chance of developing a cold. However, it is always recommended to get plenty of rest and practice good nutrition. °TREATMENT  °Treatment is directed at relieving symptoms. There is no cure. Antibiotics are not effective, because the infection is caused by a virus, not by bacteria. Treatment may include: °· Increased fluid intake. Sports drinks offer valuable electrolytes, sugars, and fluids. °· Breathing heated mist or steam (vaporizer or shower). °· Eating chicken soup or other clear broths, and maintaining good nutrition. °· Getting plenty of rest. °· Using gargles or lozenges for comfort. °· Controlling fevers with ibuprofen or acetaminophen as directed by your caregiver. °· Increasing usage of your inhaler if you have asthma. °Zinc gel and zinc lozenges, taken in the first 24 hours of the common cold, can shorten the duration and lessen the severity of symptoms. Pain medicines may help with fever, muscle aches, and throat pain. A variety of non-prescription medicines are available to treat congestion and runny nose. Your caregiver can make recommendations and may suggest nasal or lung inhalers for other symptoms.  °HOME CARE INSTRUCTIONS  °· Only take over-the-counter or prescription medicines for pain, discomfort, or fever as directed by your caregiver. °· Use a warm mist humidifier or inhale steam from a shower to increase air moisture. This may keep   secretions moist and make it easier to breathe. °· Drink enough water and fluids to keep your urine clear or pale yellow. °· Rest as needed. °· Return to work when your temperature has returned to normal or as your caregiver advises. You may need to stay home longer to avoid infecting others. You can also use a face mask and careful hand washing to prevent spread of the virus. °SEEK MEDICAL CARE IF:  °· After the first few days, you feel you are getting worse rather than better. °· You need your caregiver's  advice about medicines to control symptoms. °· You develop chills, worsening shortness of breath, or brown or red sputum. These may be signs of pneumonia. °· You develop yellow or brown nasal discharge or pain in the face, especially when you bend forward. These may be signs of sinusitis. °· You develop a fever, swollen neck glands, pain with swallowing, or white areas in the back of your throat. These may be signs of strep throat. °SEEK IMMEDIATE MEDICAL CARE IF:  °· You have a fever. °· You develop severe or persistent headache, ear pain, sinus pain, or chest pain. °· You develop wheezing, a prolonged cough, cough up blood, or have a change in your usual mucus (if you have chronic lung disease). °· You develop sore muscles or a stiff neck. °Document Released: 09/12/2000 Document Revised: 06/11/2011 Document Reviewed: 07/21/2010 °ExitCare® Patient Information ©2014 [employer]. ° °

## 2013-05-11 NOTE — ED Provider Notes (Signed)
Medical screening examination/treatment/procedure(s) were performed by non-physician practitioner and as supervising physician I was immediately available for consultation/collaboration.  Leslee Homeavid Louis Gaw, M.D.  Reuben Likesavid C Jaythan Hinely, MD 05/11/13 1018

## 2013-08-25 ENCOUNTER — Other Ambulatory Visit: Payer: Self-pay

## 2013-08-25 DIAGNOSIS — Z1231 Encounter for screening mammogram for malignant neoplasm of breast: Secondary | ICD-10-CM

## 2013-08-26 ENCOUNTER — Ambulatory Visit (INDEPENDENT_AMBULATORY_CARE_PROVIDER_SITE_OTHER): Payer: BC Managed Care – PPO | Admitting: Family Medicine

## 2013-08-26 VITALS — BP 140/80 | HR 97 | Temp 99.0°F | Resp 20 | Ht 64.75 in | Wt 283.8 lb

## 2013-08-26 DIAGNOSIS — L678 Other hair color and hair shaft abnormalities: Secondary | ICD-10-CM

## 2013-08-26 DIAGNOSIS — L0291 Cutaneous abscess, unspecified: Secondary | ICD-10-CM

## 2013-08-26 DIAGNOSIS — L731 Pseudofolliculitis barbae: Secondary | ICD-10-CM

## 2013-08-26 DIAGNOSIS — Z794 Long term (current) use of insulin: Secondary | ICD-10-CM | POA: Insufficient documentation

## 2013-08-26 DIAGNOSIS — E119 Type 2 diabetes mellitus without complications: Secondary | ICD-10-CM | POA: Insufficient documentation

## 2013-08-26 DIAGNOSIS — L0390 Cellulitis, unspecified: Secondary | ICD-10-CM

## 2013-08-26 DIAGNOSIS — E1165 Type 2 diabetes mellitus with hyperglycemia: Secondary | ICD-10-CM | POA: Insufficient documentation

## 2013-08-26 DIAGNOSIS — L738 Other specified follicular disorders: Secondary | ICD-10-CM

## 2013-08-26 MED ORDER — CEPHALEXIN 500 MG PO CAPS: 500.0000 mg | ORAL_CAPSULE | Freq: Three times a day (TID) | ORAL | Status: DC

## 2013-08-26 NOTE — Progress Notes (Signed)
l °

## 2013-08-26 NOTE — Progress Notes (Signed)
   Subjective:    Patient ID: Jaime Daugherty, female    DOB: 23-Dec-1970, 43 y.o.   MRN: [id number]  HPI Patient presents with cysts and boils in genital area. Had similar problem 3-4 years ago, this was the only previous occurance. Was initially small and kept getting bigger. Irritating but not painful. No fevers. Not draining pus. No history of boils in axillae or under breasts. No known history of MRSA.   With previous episode, boil was I&D'ed. Cannot remember if she was treated with antibiotics. Has diabetes, takes metformin occasionally. Last A1c in the 7s.   Review of Systems  All other systems reviewed and are negative.      Objective:   Physical Exam  Constitutional: She is oriented to person, place, and time. She appears well-developed and well-nourished.  HENT:  Head: Normocephalic and atraumatic.  Eyes: Conjunctivae are normal. No scleral icterus.  Cardiovascular: Normal rate.   Pulmonary/Chest: Effort normal.  Neurological: She is alert and oriented to person, place, and time.  Psychiatric: She has a normal mood and affect. Her behavior is normal.  Skin: There is a 1 cm in diameter fluctuant lesion on the left labia consistent with ingrown hair. No surrounding cellulitis or other lesions. There is some induration towards the vaginal introitus.    Assessment & Plan:  #1. Ingrown hair and abscess - I&D today - Warm water bath soaks with soapy water or epsom salts - Will place on keflex for 1 week as there was some surrounding induration - F/u prn. Return precautions.  Procedure note: Skin prepped with betadine. Skin anesthetized with 2 cc of 1% lidocaine. Abscess I&D'ed using 11 blade scalpel. Pus expressed. No complications. Followup prn.

## 2013-08-26 NOTE — Patient Instructions (Addendum)
Thank you for coming in today  You have an ingrown hair that developed into a boil Take cephalexin 500 mg 3x per day for 7 days We opened this up today to drain Continue warm bath soaks in soapy water or warm water with epsom salt Avoid shaving in that area as this will increase risk of ingrown hairs Followup for worsening swelling, redness, pus, fever  Abscess An abscess is an infected area that contains a collection of pus and debris.It can occur in almost any part of the body. An abscess is also known as a furuncle or boil. CAUSES  An abscess occurs when tissue gets infected. This can occur from blockage of oil or sweat glands, infection of hair follicles, or a minor injury to the skin. As the body tries to fight the infection, pus collects in the area and creates pressure under the skin. This pressure causes pain. People with weakened immune systems have difficulty fighting infections and get certain abscesses more often.  SYMPTOMS Usually an abscess develops on the skin and becomes a painful mass that is red, warm, and tender. If the abscess forms under the skin, you may feel a moveable soft area under the skin. Some abscesses break open (rupture) on their own, but most will continue to get worse without care. The infection can spread deeper into the body and eventually into the bloodstream, causing you to feel ill.  DIAGNOSIS  Your caregiver will take your medical history and perform a physical exam. A sample of fluid may also be taken from the abscess to determine what is causing your infection. TREATMENT  Your caregiver may prescribe antibiotic medicines to fight the infection. However, taking antibiotics alone usually does not cure an abscess. Your caregiver may need to make a small cut (incision) in the abscess to drain the pus. In some cases, gauze is packed into the abscess to reduce pain and to continue draining the area. HOME CARE INSTRUCTIONS   Only take over-the-counter or  prescription medicines for pain, discomfort, or fever as directed by your caregiver.  If you were prescribed antibiotics, take them as directed. Finish them even if you start to feel better.  If gauze is used, follow your caregiver's directions for changing the gauze.  To avoid spreading the infection:  Keep your draining abscess covered with a bandage.  Wash your hands well.  Do not share personal care items, towels, or whirlpools with others.  Avoid skin contact with others.  Keep your skin and clothes clean around the abscess.  Keep all follow-up appointments as directed by your caregiver. SEEK MEDICAL CARE IF:   You have increased pain, swelling, redness, fluid drainage, or bleeding.  You have muscle aches, chills, or a general ill feeling.  You have a fever. MAKE SURE YOU:   Understand these instructions.  Will watch your condition.  Will get help right away if you are not doing well or get worse. Document Released: 12/27/2004 Document Revised: 09/18/2011 Document Reviewed: 06/01/2011 [facility] Patient Information 2014 [place], [place].

## 2013-10-19 ENCOUNTER — Ambulatory Visit
Admission: RE | Admit: 2013-10-19 | Discharge: 2013-10-19 | Disposition: A | Discharge status: Home / Self Care | Payer: BC Managed Care – PPO | Source: Ambulatory Visit

## 2013-10-19 DIAGNOSIS — Z1231 Encounter for screening mammogram for malignant neoplasm of breast: Secondary | ICD-10-CM

## 2013-11-12 ENCOUNTER — Ambulatory Visit (INDEPENDENT_AMBULATORY_CARE_PROVIDER_SITE_OTHER): Payer: BC Managed Care – PPO | Admitting: Emergency Medicine

## 2013-11-12 VITALS — BP 128/80 | HR 101 | Temp 98.5°F | Resp 16 | Ht 64.75 in | Wt 277.4 lb

## 2013-11-12 DIAGNOSIS — N898 Other specified noninflammatory disorders of vagina: Secondary | ICD-10-CM

## 2013-11-12 DIAGNOSIS — L0293 Carbuncle, unspecified: Secondary | ICD-10-CM

## 2013-11-12 DIAGNOSIS — E119 Type 2 diabetes mellitus without complications: Secondary | ICD-10-CM

## 2013-11-12 DIAGNOSIS — L0292 Furuncle, unspecified: Secondary | ICD-10-CM

## 2013-11-12 DIAGNOSIS — L293 Anogenital pruritus, unspecified: Secondary | ICD-10-CM

## 2013-11-12 LAB — POCT URINALYSIS DIPSTICK
Bilirubin, UA: NEGATIVE
Glucose, UA: 500
Ketones, UA: NEGATIVE
NITRITE UA: NEGATIVE
PH UA: 5
Spec Grav, UA: 1.015
UROBILINOGEN UA: 0.2

## 2013-11-12 LAB — POCT WET PREP WITH KOH
Clue Cells Wet Prep HPF POC: NEGATIVE
KOH PREP POC: POSITIVE
Trichomonas, UA: NEGATIVE

## 2013-11-12 MED ORDER — FLUCONAZOLE 150 MG PO TABS: 150.0000 mg | ORAL_TABLET | Freq: Once | ORAL | Status: DC

## 2013-11-12 MED ORDER — CLOTRIMAZOLE 1 % VA CREA: 1.0000 | TOPICAL_CREAM | Freq: Every day | VAGINAL | Status: DC

## 2013-11-12 MED ORDER — CEPHALEXIN 500 MG PO CAPS: 500.0000 mg | ORAL_CAPSULE | Freq: Two times a day (BID) | ORAL | Status: DC

## 2013-11-12 NOTE — Progress Notes (Signed)
Patient ID: Jaime Daugherty MRN: [id number], DOB: Jun 21, 1970, 43 y.o. Date of Encounter: 11/12/2013, 9:10 AM    PROCEDURE NOTE: Verbal consent obtained. Local anesthesia obtained with 2% lidocaine plain.   0.5 cm incision made with 11 blade along lesion.  Culture taken. Small amount of purulence expressed. Lesion explored revealing no loculations. Not packed.  Dressed. Wound care instructions including precautions with patient. Patient tolerated the procedure well. Recheck as needed.      Grier MittsSigned, Rekita Miotke, PA-C 11/12/2013 9:10 AM

## 2013-11-12 NOTE — Progress Notes (Signed)
   Subjective:    Patient ID: Jaime Daugherty, female    DOB: 15-Jul-1970, 43 y.o.   MRN: [id number]  HPI 43 year old female presents with "boil" on the labia. She has a history of diabetes mellitus. She's currently on ental,. This is her second episode of boil formation since she started the medication.   Review of Systems     Objective:   Physical Exam  1x1/5 cm nodule on lower portion of labia menora on the right.   Swab for yeast infection.   Results for orders placed in visit on 11/12/13  POCT WET PREP WITH KOH      Result Value Ref Range   Trichomonas, UA Negative     Clue Cells Wet Prep HPF POC neg     Epithelial Wet Prep HPF POC 6-18(clusters)     Yeast Wet Prep HPF POC buds     Bacteria Wet Prep HPF POC 3+     RBC Wet Prep HPF POC 2-6     WBC Wet Prep HPF POC 1-3     KOH Prep POC Positive    POCT URINALYSIS DIPSTICK      Result Value Ref Range   Color, UA yellow     Clarity, UA clear     Glucose, UA 500     Bilirubin, UA neg     Ketones, UA neg     Spec Grav, UA 1.015     Blood, UA large     pH, UA 5.0     Protein, UA trace     Urobilinogen, UA 0.2     Nitrite, UA neg     Leukocytes, UA Trace        Assessment & Plan:  Patient has dysuria secondary to the medications she is on. There were yeast buds present on the KOH with a positive KOH prep. Will treat with Diflucan and cephalexin and GYN Lotrimin ointment. She is to talk with her PCP regarding her current diabetes treatment.

## 2013-11-15 LAB — WOUND CULTURE
Gram Stain: NONE SEEN
Gram Stain: NONE SEEN
Gram Stain: NONE SEEN

## 2015-01-10 ENCOUNTER — Other Ambulatory Visit: Payer: Self-pay

## 2015-01-10 DIAGNOSIS — Z1231 Encounter for screening mammogram for malignant neoplasm of breast: Secondary | ICD-10-CM

## 2015-01-25 ENCOUNTER — Ambulatory Visit: Payer: Self-pay

## 2015-02-10 ENCOUNTER — Ambulatory Visit
Admission: RE | Admit: 2015-02-10 | Discharge: 2015-02-10 | Disposition: A | Discharge status: Home / Self Care | Payer: BLUE CROSS/BLUE SHIELD | Source: Ambulatory Visit

## 2015-02-10 DIAGNOSIS — Z1231 Encounter for screening mammogram for malignant neoplasm of breast: Secondary | ICD-10-CM

## 2015-03-31 ENCOUNTER — Encounter: Payer: Self-pay | Admitting: Family Medicine

## 2015-03-31 ENCOUNTER — Ambulatory Visit (INDEPENDENT_AMBULATORY_CARE_PROVIDER_SITE_OTHER): Payer: BLUE CROSS/BLUE SHIELD | Admitting: Family Medicine

## 2015-03-31 ENCOUNTER — Ambulatory Visit (INDEPENDENT_AMBULATORY_CARE_PROVIDER_SITE_OTHER): Payer: BLUE CROSS/BLUE SHIELD

## 2015-03-31 VITALS — BP 128/80 | HR 85 | Temp 98.0°F | Resp 16 | Ht 65.0 in | Wt 282.0 lb

## 2015-03-31 DIAGNOSIS — Z9119 Patient's noncompliance with other medical treatment and regimen: Secondary | ICD-10-CM

## 2015-03-31 DIAGNOSIS — R062 Wheezing: Secondary | ICD-10-CM

## 2015-03-31 DIAGNOSIS — R0781 Pleurodynia: Secondary | ICD-10-CM | POA: Diagnosis not present

## 2015-03-31 DIAGNOSIS — Z794 Long term (current) use of insulin: Secondary | ICD-10-CM | POA: Diagnosis not present

## 2015-03-31 DIAGNOSIS — E119 Type 2 diabetes mellitus without complications: Secondary | ICD-10-CM

## 2015-03-31 DIAGNOSIS — R059 Cough, unspecified: Secondary | ICD-10-CM

## 2015-03-31 DIAGNOSIS — Z91199 Patient's noncompliance with other medical treatment and regimen due to unspecified reason: Secondary | ICD-10-CM

## 2015-03-31 DIAGNOSIS — R05 Cough: Secondary | ICD-10-CM

## 2015-03-31 DIAGNOSIS — J209 Acute bronchitis, unspecified: Secondary | ICD-10-CM | POA: Diagnosis not present

## 2015-03-31 LAB — POC MICROSCOPIC URINALYSIS (UMFC): Mucus: ABSENT

## 2015-03-31 LAB — POCT URINALYSIS DIP (MANUAL ENTRY)
Bilirubin, UA: NEGATIVE
Blood, UA: NEGATIVE
Glucose, UA: 500 — AB
Ketones, POC UA: NEGATIVE
Leukocytes, UA: NEGATIVE
Nitrite, UA: NEGATIVE
Protein Ur, POC: NEGATIVE
Spec Grav, UA: 1.01
Urobilinogen, UA: 0.2
pH, UA: 5

## 2015-03-31 MED ORDER — IPRATROPIUM BROMIDE 0.02 % IN SOLN
0.5000 mg | Freq: Once | RESPIRATORY_TRACT | Status: AC
  Administered 2015-03-31: 0.5 mg via RESPIRATORY_TRACT

## 2015-03-31 MED ORDER — AZITHROMYCIN 250 MG PO TABS: ORAL_TABLET | ORAL | Status: DC

## 2015-03-31 MED ORDER — ALBUTEROL SULFATE HFA 108 (90 BASE) MCG/ACT IN AERS: 2.0000 | INHALATION_SPRAY | Freq: Four times a day (QID) | RESPIRATORY_TRACT | Status: DC | PRN

## 2015-03-31 MED ORDER — FLUCONAZOLE 150 MG PO TABS: 150.0000 mg | ORAL_TABLET | Freq: Once | ORAL | Status: DC

## 2015-03-31 MED ORDER — GUAIFENESIN-CODEINE 100-10 MG/5ML PO SYRP: 5.0000 mL | ORAL_SOLUTION | Freq: Every evening | ORAL | Status: DC | PRN

## 2015-03-31 MED ORDER — BENZONATATE 100 MG PO CAPS: 200.0000 mg | ORAL_CAPSULE | Freq: Two times a day (BID) | ORAL | Status: DC | PRN

## 2015-03-31 MED ORDER — ALBUTEROL SULFATE (2.5 MG/3ML) 0.083% IN NEBU
2.5000 mg | INHALATION_SOLUTION | Freq: Once | RESPIRATORY_TRACT | Status: AC
  Administered 2015-03-31: 2.5 mg via RESPIRATORY_TRACT

## 2015-03-31 NOTE — Patient Instructions (Signed)

## 2015-03-31 NOTE — Progress Notes (Signed)
Chief Complaint:  Chief Complaint  Patient presents with  . Cough    x 1 week   . Back Pain    lower, x 1 week   . OTHER    pt. says she stays thirsty   . Fatigue    HPI: Jaime Daugherty is a 44 y.o. female who [facility] complaining of 1 week history of cough, dry cough and she has some production, she is thristy She was taking codiene meds , she started getting dry after the cough medicine.  Feels tired. SHe got the flu vaccine this year. She had a cold about 1 month ago and this was on October.  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 Subjective fevers and chills. Has DM. Denies n/v. She has not been compliant on DM meds, she is supposed to be on LEvimir but doe osnot have enough money to pick it up until possibly tomorrow until she takes care of her bills. She will try to do it tomorrow.    Past Medical History  Diagnosis Date  . Hypertension   . Diabetes mellitus without complication [facility](HCC)    Past Surgical History  Procedure Laterality Date  . Abdominal hysterectomy    . Cholecystectomy     Social History   Social History  . Marital Status: Single    Spouse Name: N/A  . Number of Children: N/A  . Years of Education: N/A   Social History Main Topics  . Smoking status: Never Smoker   . Smokeless tobacco: Never Used  . Alcohol Use: No  . Drug Use: No  . Sexual Activity: Not Asked   Other Topics Concern  . None   Social History Narrative   Family History  Problem Relation Age of Onset  . Hypertension Mother   . Hypertension Sister    No Known Allergies Prior to Admission medications   Medication Sig Start Date End Date Taking? Authorizing Provider  furosemide (LASIX) 20 MG tablet Take 20 mg by mouth 2 (two) times daily.   Yes Historical Provider, MD  lisinopril-hydrochlorothiazide (PRINZIDE,ZESTORETIC) 10-12.5 MG per tablet Take 1 tablet by mouth daily.   Yes Historical Provider, MD  cephALEXin (KEFLEX) 500 MG capsule Take 1  capsule (500 mg total) by mouth 2 (two) times daily. Patient not taking: Reported on 03/31/2015 11/12/13   Collene GobbleSteven A Daub, MD  clotrimazole (GYNE-LOTRIMIN) 1 % vaginal cream Place 1 Applicatorful vaginally at bedtime. Patient not taking: Reported on 03/31/2015 11/12/13   Collene GobbleSteven A Daub, MD  Dapagliflozin Propanediol (FARXIGA PO) Take by mouth. Reported on 03/31/2015    Historical Provider, MD  fluconazole (DIFLUCAN) 150 MG tablet Take 1 tablet (150 mg total) by mouth once. Repeat if needed Patient not taking: Reported on 03/31/2015 11/12/13   Collene GobbleSteven A Daub, MD     ROS: The patient denies night sweats, unintentional weight loss, chest pain, palpitations, wheezing, dyspnea on exertion, nausea, vomiting, abdominal pain, dysuria, hematuria, melena, numbness, weakness, or tingling.   All other systems have been reviewed and were otherwise negative with the exception of those mentioned in the HPI and as above.    PHYSICAL EXAM: Filed Vitals:   03/31/15 1724  BP: 128/80  Pulse: 85  Temp: 98 F (36.7 C)  Resp: 16   Body mass index is 46.93 kg/(m^2).   General: Alert, no acute distress, morbidly obese female  Erythematous throat, no exudates, TM normal, + sinus tenderness, + erythematous/boggy nasal  mucosa HEENT:  Normocephalic, atraumatic, oropharynx patent. EOMI, PERRLA Cardiovascular:  Regular rate and rhythm, no rubs murmurs or gallops.  No Carotid bruits, radial pulse intact. No pedal edema.  Respiratory: Clear to auscultation bilaterally.  +wheezes, No rales, or rhonchi.  No cyanosis, no use of accessory musculature Abdominal: No organomegaly, abdomen is soft and non-tender, positive bowel sounds. No masses. Skin: No rashes. Neurologic: Facial musculature symmetric. Psychiatric: Patient acts appropriately throughout our interaction. Lymphatic: No cervical or submandibular lymphadenopathy Musculoskeletal: Gait intact. No edema, tenderness   LABS: Results for orders placed or performed  in visit on 03/31/15  POCT urinalysis dipstick  Result Value Ref Range   Color, UA yellow yellow   Clarity, UA clear clear   Glucose, UA =500 (A) negative   Bilirubin, UA negative negative   Ketones, POC UA negative negative   Spec Grav, UA 1.010    Blood, UA negative negative   pH, UA 5.0    Protein Ur, POC negative negative   Urobilinogen, UA 0.2    Nitrite, UA Negative Negative   Leukocytes, UA Negative Negative  POCT Microscopic Urinalysis ([facility])  Result Value Ref Range   WBC,UR,HPF,POC None None WBC/hpf   RBC,UR,HPF,POC None None RBC/hpf   Bacteria None None, Too numerous to count   Mucus Absent Absent   Epithelial Cells, UR Per Microscopy Few (A) None, Too numerous to count cells/hpf     EKG/XRAY:   Primary read interpreted by Dr. Conley Rolls at [facility]. No acute cardiopulmonary process   ASSESSMENT/PLAN: Encounter Diagnoses  Name Primary?  . Cough   . Pleuritic chest pain   . Type 2 diabetes mellitus without complication, without long-term current use of insulin (HCC)   . Wheezing   . Acute bronchitis, unspecified organism Yes   Rx  Azithromycin, tessalon perles, robitussin with codeein, and also albuterol inhaler and diflucan prn for yeast on abx Neb treatment x 1 , felt better after nebs, no wheezing afterwards Advise to go get Levemir and restart insulin.  Fu prn   Gross sideeffects, risk and benefits, and alternatives of medications d/w patient. Patient is aware that all medications have potential sideeffects and we are unable to predict every sideeffect or drug-drug interaction that may occur.  Thao Le DO  03/31/2015 7:11 PM

## 2015-06-16 ENCOUNTER — Ambulatory Visit (INDEPENDENT_AMBULATORY_CARE_PROVIDER_SITE_OTHER): Payer: BLUE CROSS/BLUE SHIELD | Admitting: Family Medicine

## 2015-06-16 ENCOUNTER — Ambulatory Visit (INDEPENDENT_AMBULATORY_CARE_PROVIDER_SITE_OTHER): Payer: BLUE CROSS/BLUE SHIELD

## 2015-06-16 ENCOUNTER — Encounter: Payer: Self-pay | Admitting: *Deleted

## 2015-06-16 ENCOUNTER — Other Ambulatory Visit: Payer: Self-pay | Admitting: Family Medicine

## 2015-06-16 VITALS — BP 122/84 | HR 102 | Temp 98.2°F | Resp 18 | Wt 272.8 lb

## 2015-06-16 DIAGNOSIS — R748 Abnormal levels of other serum enzymes: Secondary | ICD-10-CM

## 2015-06-16 DIAGNOSIS — R109 Unspecified abdominal pain: Secondary | ICD-10-CM

## 2015-06-16 DIAGNOSIS — R12 Heartburn: Secondary | ICD-10-CM

## 2015-06-16 DIAGNOSIS — K5900 Constipation, unspecified: Secondary | ICD-10-CM

## 2015-06-16 DIAGNOSIS — Z794 Long term (current) use of insulin: Secondary | ICD-10-CM

## 2015-06-16 DIAGNOSIS — R079 Chest pain, unspecified: Secondary | ICD-10-CM

## 2015-06-16 DIAGNOSIS — R1011 Right upper quadrant pain: Secondary | ICD-10-CM

## 2015-06-16 DIAGNOSIS — E119 Type 2 diabetes mellitus without complications: Secondary | ICD-10-CM

## 2015-06-16 LAB — POCT URINALYSIS DIP (MANUAL ENTRY)
Bilirubin, UA: NEGATIVE
Blood, UA: NEGATIVE
Glucose, UA: >=1000 — AB
Ketones, POC UA: NEGATIVE
LEUKOCYTES UA: NEGATIVE
Nitrite, UA: NEGATIVE
PH UA: 5
SPEC GRAV UA: 1.02
UROBILINOGEN UA: 0.2

## 2015-06-16 LAB — COMPREHENSIVE METABOLIC PANEL
ALT: 28 U/L (ref 6–29)
AST: 25 U/L (ref 10–30)
Albumin: 4.5 g/dL (ref 3.6–5.1)
Alkaline Phosphatase: 142 U/L — ABNORMAL HIGH (ref 33–115)
BUN: 9 mg/dL (ref 7–25)
CHLORIDE: 104 mmol/L (ref 98–110)
CO2: 26 mmol/L (ref 20–31)
CREATININE: 0.69 mg/dL (ref 0.50–1.10)
Calcium: 9.7 mg/dL (ref 8.6–10.2)
GLUCOSE: 122 mg/dL — AB (ref 65–99)
POTASSIUM: 3.8 mmol/L (ref 3.5–5.3)
SODIUM: 139 mmol/L (ref 135–146)
TOTAL PROTEIN: 7.1 g/dL (ref 6.1–8.1)
Total Bilirubin: 0.3 mg/dL (ref 0.2–1.2)

## 2015-06-16 LAB — POCT CBC
GRANULOCYTE PERCENT: 72.4 % (ref 37–80)
HEMATOCRIT: 40.7 % (ref 37.7–47.9)
HEMOGLOBIN: 13.8 g/dL (ref 12.2–16.2)
Lymph, poc: 3.2 (ref 0.6–3.4)
MCH, POC: 23.3 pg — AB (ref 27–31.2)
MCHC: 34 g/dL (ref 31.8–35.4)
MCV: 68.5 fL — AB (ref 80–97)
MID (cbc): 0.1 (ref 0–0.9)
MPV: 8.8 fL (ref 0–99.8)
PLATELET COUNT, POC: 201 10*3/uL (ref 142–424)
POC GRANULOCYTE: 8.7 — AB (ref 2–6.9)
POC LYMPH %: 26.6 % (ref 10–50)
POC MID %: 1 %M (ref 0–12)
RBC: 5.94 M/uL — AB (ref 4.04–5.48)
RDW, POC: 15.5 %
WBC: 12 10*3/uL — AB (ref 4.6–10.2)

## 2015-06-16 LAB — POC MICROSCOPIC URINALYSIS (UMFC): MUCUS RE: ABSENT

## 2015-06-16 LAB — GLUCOSE, POCT (MANUAL RESULT ENTRY): POC GLUCOSE: 141 mg/dL — AB (ref 70–99)

## 2015-06-16 MED ORDER — POLYETHYLENE GLYCOL 3350 17 GM/SCOOP PO POWD: 17.0000 g | Freq: Two times a day (BID) | ORAL | Status: DC | PRN

## 2015-06-16 MED ORDER — NAPROXEN SODIUM 220 MG PO TABS: 220.0000 mg | ORAL_TABLET | Freq: Two times a day (BID) | ORAL | Status: DC

## 2015-06-16 NOTE — Progress Notes (Signed)
Patient ID: Jaime Daugherty, female    DOB: [birth year]  Age: 45 y.o. MRN: [id number]  Chief Complaint  Patient presents with  . Heartburn    x1 week, antiacids are not helping.     Subjective:   45 year old lady who has a primary care physician whom she saw a little over a week ago for her diabetes. She had a change in her medications at that time a little bit. She has over the last 6 or 7 days had symptoms of heartburn and discomfort in her right chest, right upper abdominal area, and right flank. She says taking antacids has not given her a lot of relief. She does have a history of some constipation, going a small amount of hard stool a couple times a day maybe. She has had her gallbladder out some years ago. She has not had any vomiting, maybe some nausea. She has continued working despite this. She does work at lab regularly. She takes her medicines, some which are samples that were just started pending putting her on a prescription for the new diabetic medicine.  Current allergies, medications, problem list, past/family and social histories reviewed.  Objective:  BP 122/84 mmHg  Pulse 102  Temp(Src) 98.2 F (36.8 C) (Oral)  Resp 18  Wt 272 lb 12.8 oz (123.741 kg)  SpO2 98%  Pleasant lady, alert and oriented, no major distress. Throat clear. Neck supple without nodes. Chest clear. Heart regular without murmur. Abdomen soft without masses or particular tenderness. Cholecystectomy scars are present from the laparoscopic procedure. She also has had a partial hysterectomy.  Assessment & Plan:   Assessment: 1. Heartburn   2. Right-sided chest pain   3. Abdominal pain, right upper quadrant   4. Right flank pain   5. Constipation, unspecified constipation type   6. Type 2 diabetes mellitus without complication, with long-term current use of insulin (HCC)       Plan: We'll check some basic labs and then discuss further.  Orders Placed This Encounter  Procedures  . DG Abd Acute  W/Chest    Order Specific Question:  Reason for exam:    Answer:  right chest and right upper abdomen and right flank pain/heartburn; has had cholecystectomy and partial hysterectomy    Order Specific Question:  Is the patient pregnant?    Answer:  No    Order Specific Question:  Preferred imaging location?    Answer:  External  . Comprehensive metabolic panel  . POCT urinalysis dipstick  . POCT Microscopic Urinalysis ([facility])  . POCT CBC  . POCT glucose (manual entry)   Results for orders placed or performed in visit on 06/16/15  POCT urinalysis dipstick  Result Value Ref Range   Color, UA yellow yellow   Clarity, UA clear clear   Glucose, UA >=1,000 (A) negative   Bilirubin, UA negative negative   Ketones, POC UA negative negative   Spec Grav, UA 1.020    Blood, UA negative negative   pH, UA 5.0    Protein Ur, POC trace (A) negative   Urobilinogen, UA 0.2    Nitrite, UA Negative Negative   Leukocytes, UA Negative Negative  POCT Microscopic Urinalysis ([facility])  Result Value Ref Range   WBC,UR,HPF,POC None None WBC/hpf   RBC,UR,HPF,POC None None RBC/hpf   Bacteria None None, Too numerous to count   Mucus Absent Absent   Epithelial Cells, UR Per Microscopy Few (A) None, Too numerous to count cells/hpf  POCT CBC  Result Value Ref  Range   WBC 12.0 (A) 4.6 - 10.2 K/uL   Lymph, poc 3.2 0.6 - 3.4   POC LYMPH PERCENT 26.6 10 - 50 %L   MID (cbc) 0.1 0 - 0.9   POC MID % 1.0 0 - 12 %M   POC Granulocyte 8.7 (A) 2 - 6.9   Granulocyte percent 72.4 37 - 80 %G   RBC 5.94 (A) 4.04 - 5.48 M/uL   Hemoglobin 13.8 12.2 - 16.2 g/dL   HCT, POC 16.1 09.6 - 47.9 %   MCV 68.5 (A) 80 - 97 fL   MCH, POC 23.3 (A) 27 - 31.2 pg   MCHC 34.0 31.8 - 35.4 g/dL   RDW, POC 04.5 %   Platelet Count, POC 201 142 - 424 K/uL   MPV 8.8 0 - 99.8 fL  POCT glucose (manual entry)  Result Value Ref Range   POC Glucose 141 (A) 70 - 99 mg/dl    Probably pain from gas pain trapped by the  constipation.     Patient Instructions  Drink plenty of fluids  Try and eat plenty of fruits and vegetables. However you should avoid excessive bananas because they can constipate.  Get regular exercise, especially taking a walk after eating can help stimulate the bowels.  Take the MiraLAX once or twice daily until stools are on the loose side, then decrease to every couple of days or less as needed  In the event of acute abdominal pain return or go to the emergency room  Continue your other medications regularly  If needed use a glycerin suppository along with taking the MiraLAX  Take Tylenol 500 mg 2 pills 3 times daily if needed for pain  Or take Aleve 220 mg 1 or 2 tablets twice daily as needed for pain  Constipation, Adult Constipation is when a person has fewer than three bowel movements a week, has difficulty having a bowel movement, or has stools that are dry, hard, or larger than normal. As people grow older, constipation is more common. A low-fiber diet, not taking in enough fluids, and taking certain medicines may make constipation worse.  CAUSES   Certain medicines, such as antidepressants, pain medicine, iron supplements, antacids, and water pills.   Certain diseases, such as diabetes, irritable bowel syndrome (IBS), thyroid disease, or depression.   Not drinking enough water.   Not eating enough fiber-rich foods.   Stress or travel.   Lack of physical activity or exercise.   Ignoring the urge to have a bowel movement.   Using laxatives too much.  SIGNS AND SYMPTOMS   Having fewer than three bowel movements a week.   Straining to have a bowel movement.   Having stools that are hard, dry, or larger than normal.   Feeling full or bloated.   Pain in the lower abdomen.   Not feeling relief after having a bowel movement.  DIAGNOSIS  Your health care provider will take a medical history and perform a physical exam. Further testing may be  done for severe constipation. Some tests may include:  A barium enema X-ray to examine your rectum, colon, and, sometimes, your small intestine.   A sigmoidoscopy to examine your lower colon.   A colonoscopy to examine your entire colon. TREATMENT  Treatment will depend on the severity of your constipation and what is causing it. Some dietary treatments include drinking more fluids and eating more fiber-rich foods. Lifestyle treatments may include regular exercise. If these diet and lifestyle recommendations do  not help, your health care provider may recommend taking over-the-counter laxative medicines to help you have bowel movements. Prescription medicines may be prescribed if over-the-counter medicines do not work.  HOME CARE INSTRUCTIONS   Eat foods that have a lot of fiber, such as fruits, vegetables, whole grains, and beans.  Limit foods high in fat and processed sugars, such as french fries, hamburgers, cookies, candies, and soda.   A fiber supplement may be added to your diet if you cannot get enough fiber from foods.   Drink enough fluids to keep your urine clear or pale yellow.   Exercise regularly or as directed by your health care provider.   Go to the restroom when you have the urge to go. Do not hold it.   Only take over-the-counter or prescription medicines as directed by your health care provider. Do not take other medicines for constipation without talking to your health care provider first.  SEEK IMMEDIATE MEDICAL CARE IF:   You have bright red blood in your stool.   Your constipation lasts for more than 4 days or gets worse.   You have abdominal or rectal pain.   You have thin, pencil-like stools.   You have unexplained weight loss. MAKE SURE YOU:   Understand these instructions.  Will watch your condition.  Will get help right away if you are not doing well or get worse.   This information is not intended to replace advice given to you by  your health care provider. Make sure you discuss any questions you have with your health care provider.   Document Released: 12/16/2003 Document Revised: 04/09/2014 Document Reviewed: 12/29/2012 Elsevier Interactive Patient Education 2016 [employer]   IF you received an x-ray today, you will receive an invoice from [facility]. Please contact [facility] at [phone] with questions or concerns regarding your invoice.   IF you received labwork today, you will receive an invoice from [facility]. Please contact [facility] at [phone] with questions or concerns regarding your invoice.   Our billing staff will not be able to assist you with questions regarding bills from these companies.  You will be contacted with the lab results as soon as they are available. The fastest way to get your results is to activate your My Chart account. Instructions are located on the last page of this paperwork. If you have not heard from us regarding the results in 2 weeks, please contact this office.       Return if symptoms worsen or fail to improve.   HOPPER,DAVID, MD 06/16/2015

## 2015-06-16 NOTE — Patient Instructions (Addendum)
Drink plenty of fluids  Try and eat plenty of fruits and vegetables. However you should avoid excessive bananas because they can constipate.  Get regular exercise, especially taking a walk after eating can help stimulate the bowels.  Take the MiraLAX once or twice daily until stools are on the loose side, then decrease to every couple of days or less as needed  In the event of acute abdominal pain return or go to the emergency room  Continue your other medications regularly  If needed use a glycerin suppository along with taking the MiraLAX  Take Tylenol 500 mg 2 pills 3 times daily if needed for pain  Or take Aleve 220 mg 1 or 2 tablets twice daily as needed for pain  Constipation, Adult Constipation is when a person has fewer than three bowel movements a week, has difficulty having a bowel movement, or has stools that are dry, hard, or larger than normal. As people grow older, constipation is more common. A low-fiber diet, not taking in enough fluids, and taking certain medicines may make constipation worse.  CAUSES   Certain medicines, such as antidepressants, pain medicine, iron supplements, antacids, and water pills.   Certain diseases, such as diabetes, irritable bowel syndrome (IBS), thyroid disease, or depression.   Not drinking enough water.   Not eating enough fiber-rich foods.   Stress or travel.   Lack of physical activity or exercise.   Ignoring the urge to have a bowel movement.   Using laxatives too much.  SIGNS AND SYMPTOMS   Having fewer than three bowel movements a week.   Straining to have a bowel movement.   Having stools that are hard, dry, or larger than normal.   Feeling full or bloated.   Pain in the lower abdomen.   Not feeling relief after having a bowel movement.  DIAGNOSIS  Your health care provider will take a medical history and perform a physical exam. Further testing may be done for severe constipation. Some tests may  include:  A barium enema X-ray to examine your rectum, colon, and, sometimes, your small intestine.   A sigmoidoscopy to examine your lower colon.   A colonoscopy to examine your entire colon. TREATMENT  Treatment will depend on the severity of your constipation and what is causing it. Some dietary treatments include drinking more fluids and eating more fiber-rich foods. Lifestyle treatments may include regular exercise. If these diet and lifestyle recommendations do not help, your health care provider may recommend taking over-the-counter laxative medicines to help you have bowel movements. Prescription medicines may be prescribed if over-the-counter medicines do not work.  HOME CARE INSTRUCTIONS   Eat foods that have a lot of fiber, such as fruits, vegetables, whole grains, and beans.  Limit foods high in fat and processed sugars, such as french fries, hamburgers, cookies, candies, and soda.   A fiber supplement may be added to your diet if you cannot get enough fiber from foods.   Drink enough fluids to keep your urine clear or pale yellow.   Exercise regularly or as directed by your health care provider.   Go to the restroom when you have the urge to go. Do not hold it.   Only take over-the-counter or prescription medicines as directed by your health care provider. Do not take other medicines for constipation without talking to your health care provider first.  SEEK IMMEDIATE MEDICAL CARE IF:   You have bright red blood in your stool.   Your constipation lasts  for more than 4 days or gets worse.   You have abdominal or rectal pain.   You have thin, pencil-like stools.   You have unexplained weight loss. MAKE SURE YOU:   Understand these instructions.  Will watch your condition.  Will get help right away if you are not doing well or get worse.   This information is not intended to replace advice given to you by your health care provider. Make sure you discuss  any questions you have with your health care provider.   Document Released: 12/16/2003 Document Revised: 04/09/2014 Document Reviewed: 12/29/2012 Elsevier Interactive Patient Education 2016 [employer]   IF you received an x-ray today, you will receive an invoice from [facility]. Please contact [facility] at [phone] with questions or concerns regarding your invoice.   IF you received labwork today, you will receive an invoice from [facility]. Please contact [facility] at [phone] with questions or concerns regarding your invoice.   Our billing staff will not be able to assist you with questions regarding bills from these companies.  You will be contacted with the lab results as soon as they are available. The fastest way to get your results is to activate your My Chart account. Instructions are located on the last page of this paperwork. If you have not heard from us regarding the results in 2 weeks, please contact this office.

## 2016-03-06 ENCOUNTER — Other Ambulatory Visit: Payer: Self-pay | Admitting: Obstetrics and Gynecology

## 2016-03-06 DIAGNOSIS — Z1231 Encounter for screening mammogram for malignant neoplasm of breast: Secondary | ICD-10-CM

## 2016-03-30 ENCOUNTER — Ambulatory Visit
Admission: RE | Admit: 2016-03-30 | Discharge: 2016-03-30 | Disposition: A | Discharge status: Home / Self Care | Payer: BLUE CROSS/BLUE SHIELD | Source: Ambulatory Visit | Attending: Obstetrics and Gynecology | Admitting: Obstetrics and Gynecology

## 2016-03-30 DIAGNOSIS — Z1231 Encounter for screening mammogram for malignant neoplasm of breast: Secondary | ICD-10-CM

## 2016-05-15 ENCOUNTER — Other Ambulatory Visit: Payer: Self-pay | Admitting: Oncology

## 2016-06-27 ENCOUNTER — Ambulatory Visit: Payer: BLUE CROSS/BLUE SHIELD | Admitting: Endocrinology

## 2016-07-24 ENCOUNTER — Ambulatory Visit (INDEPENDENT_AMBULATORY_CARE_PROVIDER_SITE_OTHER): Payer: BLUE CROSS/BLUE SHIELD | Admitting: Endocrinology

## 2016-07-24 ENCOUNTER — Encounter: Payer: Self-pay | Admitting: Endocrinology

## 2016-07-24 VITALS — BP 164/100 | HR 105 | Ht 65.0 in | Wt 284.4 lb

## 2016-07-24 DIAGNOSIS — Z794 Long term (current) use of insulin: Secondary | ICD-10-CM | POA: Diagnosis not present

## 2016-07-24 DIAGNOSIS — E1142 Type 2 diabetes mellitus with diabetic polyneuropathy: Secondary | ICD-10-CM

## 2016-07-24 LAB — POCT GLYCOSYLATED HEMOGLOBIN (HGB A1C): HEMOGLOBIN A1C: 10.5

## 2016-07-24 MED ORDER — METFORMIN HCL ER 500 MG PO TB24: 2000.0000 mg | ORAL_TABLET | Freq: Every day | ORAL | 11 refills | Status: DC

## 2016-07-24 NOTE — Progress Notes (Signed)
Subjective:    Patient ID: Jaime Daugherty, female    DOB: Mar 31, 1971, 46 y.o.   MRN: [id number]  HPI pt is referred by Dr Ludwig Clarks, for diabetes.  Pt states DM was dx'ed in 2013; she has mild neuropathy of the lower extremities; she is unaware of any associated chronic complications; she has been on insulin since 2015; pt says her diet and exercise are poor; she has never had GDM, pancreatitis, pancreatic surgery, severe hypoglycemia or DKA.  She takes soliqua, 52 units each morning.  She has been on this dosage x 6 months.  She says fasting cbg's are in the 200's.    Past Medical History:  Diagnosis Date  . Diabetes mellitus without complication (HCC)   . Hypertension     Past Surgical History:  Procedure Laterality Date  . ABDOMINAL HYSTERECTOMY    . CHOLECYSTECTOMY      Social History   Social History  . Marital status: Single    Spouse name: N/A  . Number of children: N/A  . Years of education: N/A   Occupational History  . Not on file.   Social History Main Topics  . Smoking status: Current Every Day Smoker    Types: Cigarettes  . Smokeless tobacco: Never Used  . Alcohol use No  . Drug use: No  . Sexual activity: Not on file   Other Topics Concern  . Not on file   Social History Narrative  . No narrative on file    Current Outpatient Prescriptions on File Prior to Visit  Medication Sig Dispense Refill  . furosemide (LASIX) 20 MG tablet Take 20 mg by mouth 2 (two) times daily. Reported on 06/16/2015    . lisinopril-hydrochlorothiazide (PRINZIDE,ZESTORETIC) 10-12.5 MG per tablet Take 1 tablet by mouth daily.    Marland Kitchen albuterol (PROVENTIL HFA;VENTOLIN HFA) 108 (90 Base) MCG/ACT inhaler Inhale 2 puffs into the lungs every 6 (six) hours as needed for wheezing or shortness of breath. (Patient not taking: Reported on 06/16/2015) 1 Inhaler 0   No current facility-administered medications on file prior to visit.     No Known Allergies  Family History  Problem  Relation Age of Onset  . Hypertension Mother   . Hypertension Sister   . Diabetes Paternal Grandmother     BP (!) 164/100   Pulse (!) 105   Ht  (1.651 m)   Wt 284 lb 6.4 oz (129 kg)   SpO2 98%   BMI 47.33 kg/m    Review of Systems denies weight loss, blurry vision, headache, chest pain, sob, n/v, excessive diaphoresis, cold intolerance, rhinorrhea, and easy bruising.  She has frequent urination, depression, and muscle cramps.      Objective:   Physical Exam VS: see vs page GEN: no distress.  Morbid obesity.   HEAD: head: no deformity eyes: no periorbital swelling; slight bilateral proptosis external nose and ears are normal mouth: no lesion seen NECK: supple, thyroid is not enlarged CHEST WALL: no deformity LUNGS: clear to auscultation CV: reg rate and rhythm, no murmur.  ABD: abdomen is soft, nontender.  no hepatosplenomegaly.  not distended.  no hernia.  MUSCULOSKELETAL: muscle bulk and strength are grossly normal.  no obvious joint swelling.  gait is normal and steady EXTEMITIES: no deformity.  no ulcer on the feet.  feet are of normal color and temp.  no edema PULSES: dorsalis pedis intact bilat.  no carotid bruit NEURO:  cn 2-12 grossly intact.   readily moves all 4's.  sensation is intact to touch on the feet.   SKIN:  Normal texture and temperature.  No rash or suspicious lesion is visible.   NODES:  None palpable at the neck PSYCH: alert, well-oriented.  Does not appear anxious nor depressed.   a1c=10.5%  I have reviewed outside records, and summarized: Pt was noted to have severely elevated a1c, and referred here.  Noncompliance with f/u appts was noted.  Pt continues to smoke.      Assessment & Plan:  Insulin-requiring type 2 DM, with polyneuropathy: severe exacerbation.  Obesity: she is a good surgical candidate.   HTN: not well-controlled.    Patient Instructions  good diet and exercise significantly improve the control of your diabetes.  please let  me know if you wish to be referred to a dietician.  high blood sugar is very risky to your health.  you should see an eye doctor and dentist every year.  It is very important to get all recommended vaccinations.  Controlling your blood pressure and cholesterol drastically reduces the damage diabetes does to your body.  Those who smoke should quit.  Please discuss these with your doctor.   check your blood sugar twice a day.  vary the time of day when you check, between before the 3 meals, and at bedtime.  also check if you have symptoms of your blood sugar being too high or too low.  please keep a record of the readings and bring it to your next appointment here (or you can bring the meter itself).  You can write it on any piece of paper.  please call us sooner if your blood sugar goes below 70, or if you have a lot of readings over 200.   Please continue the same soliqua, and: I have sent a prescription to your pharmacy, to add metformin.  Increase every few days, until you take 4/day. Please call us next week, to tell us how the blood sugar is doing.  If necessary, we can add "farxiga."   Please come back for a follow-up appointment in 2-3 weeks.   Please have your blood pressure rechecked soon, with Dr Cindee Lame    Bariatric Surgery You have so much to gain by losing weight.  You may have already tried every diet and exercise plan imaginable.  And, you may have sought advice from your family physician, too.   Sometimes, in spite of such diligent efforts, you may not be able to achieve long-term results by yourself.  In cases of severe obesity, bariatric or weight loss surgery is a proven method of achieving long-term weight control.  Our Services Our bariatric surgery programs offer our patients new hope and long-term weight-loss solution.  Since introducing our services in 2003, we have conducted more than 2,400 successful procedures.  Our program is designated as a Investment banker, corporate by the  Metabolic and Bariatric Surgery Accreditation and Quality Improvement Program (MBSAQIP), a Child psychotherapist that sets rigorous patient safety and outcome standards.  Our program is also designated as a Engineer, manufacturing systems by [employer].   Our exceptional weight-loss surgery team specializes in diagnosis, treatment, follow-up care, and ongoing support for our patients with severe weight loss challenges.  We currently offer laparoscopic sleeve gastrectomy, gastric bypass, and adjustable gastric band (LAP-BAND).    Attend our Bariatrics Seminar Choosing to undergo a bariatric procedure is a big decision, and one that should not be taken lightly.  You now have two options in how you  learn about weight-loss surgery - in person or online.  Our objective is to ensure you have all of the information that you need to evaluate the advantages and obligations of this life changing procedure.  Please note that you are not alone in this process, and our experienced team is ready to assist and answer all of your questions.  There are several ways to register for a seminar (either on-line or in person): 1)  Call [phone] 2) Go on-line to [facility] and register for either type of seminar.  [email or web address]

## 2016-07-24 NOTE — Patient Instructions (Addendum)
good diet and exercise significantly improve the control of your diabetes.  please let me know if you wish to be referred to a dietician.  high blood sugar is very risky to your health.  you should see an eye doctor and dentist every year.  It is very important to get all recommended vaccinations.  Controlling your blood pressure and cholesterol drastically reduces the damage diabetes does to your body.  Those who smoke should quit.  Please discuss these with your doctor.   check your blood sugar twice a day.  vary the time of day when you check, between before the 3 meals, and at bedtime.  also check if you have symptoms of your blood sugar being too high or too low.  please keep a record of the readings and bring it to your next appointment here (or you can bring the meter itself).  You can write it on any piece of paper.  please call us sooner if your blood sugar goes below 70, or if you have a lot of readings over 200.   Please continue the same soliqua, and: I have sent a prescription to your pharmacy, to add metformin.  Increase every few days, until you take 4/day. Please call us next week, to tell us how the blood sugar is doing.  If necessary, we can add "farxiga."   Please come back for a follow-up appointment in 2-3 weeks.   Please have your blood pressure rechecked soon, with Dr Cindee Lame    Bariatric Surgery You have so much to gain by losing weight.  You may have already tried every diet and exercise plan imaginable.  And, you may have sought advice from your family physician, too.   Sometimes, in spite of such diligent efforts, you may not be able to achieve long-term results by yourself.  In cases of severe obesity, bariatric or weight loss surgery is a proven method of achieving long-term weight control.  Our Services Our bariatric surgery programs offer our patients new hope and long-term weight-loss solution.  Since introducing our services in 2003, we have conducted more than 2,400  successful procedures.  Our program is designated as a Investment banker, corporate by the Metabolic and Bariatric Surgery Accreditation and Quality Improvement Program (MBSAQIP), a Child psychotherapist that sets rigorous patient safety and outcome standards.  Our program is also designated as a Engineer, manufacturing systems by [employer].   Our exceptional weight-loss surgery team specializes in diagnosis, treatment, follow-up care, and ongoing support for our patients with severe weight loss challenges.  We currently offer laparoscopic sleeve gastrectomy, gastric bypass, and adjustable gastric band (LAP-BAND).    Attend our Bariatrics Seminar Choosing to undergo a bariatric procedure is a big decision, and one that should not be taken lightly.  You now have two options in how you learn about weight-loss surgery - in person or online.  Our objective is to ensure you have all of the information that you need to evaluate the advantages and obligations of this life changing procedure.  Please note that you are not alone in this process, and our experienced team is ready to assist and answer all of your questions.  There are several ways to register for a seminar (either on-line or in person): 1)  Call [phone] 2) Go on-line to [facility] and register for either type of seminar.  [email or web address]

## 2016-08-02 ENCOUNTER — Ambulatory Visit: Payer: BLUE CROSS/BLUE SHIELD | Admitting: Endocrinology

## 2016-08-02 ENCOUNTER — Ambulatory Visit (INDEPENDENT_AMBULATORY_CARE_PROVIDER_SITE_OTHER): Payer: BLUE CROSS/BLUE SHIELD | Admitting: Endocrinology

## 2016-08-02 ENCOUNTER — Encounter: Payer: Self-pay | Admitting: Endocrinology

## 2016-08-02 VITALS — BP 122/76 | HR 112 | Ht 65.0 in | Wt 284.0 lb

## 2016-08-02 DIAGNOSIS — E1142 Type 2 diabetes mellitus with diabetic polyneuropathy: Secondary | ICD-10-CM

## 2016-08-02 DIAGNOSIS — Z794 Long term (current) use of insulin: Secondary | ICD-10-CM

## 2016-08-02 MED ORDER — DAPAGLIFLOZIN PROPANEDIOL 5 MG PO TABS: 5.0000 mg | ORAL_TABLET | Freq: Every day | ORAL | 11 refills | Status: DC

## 2016-08-02 MED ORDER — METFORMIN HCL ER 500 MG PO TB24: 1000.0000 mg | ORAL_TABLET | Freq: Every day | ORAL | 11 refills | Status: DC

## 2016-08-02 NOTE — Patient Instructions (Addendum)
check your blood sugar twice a day.  vary the time of day when you check, between before the 3 meals, and at bedtime.  also check if you have symptoms of your blood sugar being too high or too low.  please keep a record of the readings and bring it to your next appointment here (or you can bring the meter itself).  You can write it on any piece of paper.  please call us sooner if your blood sugar goes below 70, or if you have a lot of readings over 200.   Please continue the same soliqua and metformin (2/day), and: I have sent a prescription to your pharmacy, to add "farxiga."   Please call us next week, to tell us how the blood sugar is doing Please come back for a follow-up appointment in 1 month.   You should continue to pursue the weight loss surgery.

## 2016-08-02 NOTE — Progress Notes (Signed)
Subjective:    Patient ID: Jaime Daugherty, female    DOB: 1970/05/10, 46 y.o.   MRN: [id number]  HPI Pt returns for f/u of diabetes mellitus: DM type: Insulin-requiring type 2 Dx'ed: 2013 Complications: polyneuropathy Therapy: insulin since 2015; now soliqua GDM: never DKA: never Severe hypoglycemia: never Pancreatitis: never Other: she is on qd insulin, at least for now Interval history: She has only been able to increase the metformin to 2/day, due to nausea.  no cbg record, but states cbg's vary from 200-300's.   Past Medical History:  Diagnosis Date  . Diabetes mellitus without complication (HCC)   . Hypertension     Past Surgical History:  Procedure Laterality Date  . ABDOMINAL HYSTERECTOMY    . CHOLECYSTECTOMY      Social History   Social History  . Marital status: Single    Spouse name: N/A  . Number of children: N/A  . Years of education: N/A   Occupational History  . Not on file.   Social History Main Topics  . Smoking status: Current Every Day Smoker    Types: Cigarettes  . Smokeless tobacco: Never Used  . Alcohol use No  . Drug use: No  . Sexual activity: Not on file   Other Topics Concern  . Not on file   Social History Narrative  . No narrative on file    Current Outpatient Prescriptions on File Prior to Visit  Medication Sig Dispense Refill  . furosemide (LASIX) 20 MG tablet Take 20 mg by mouth 2 (two) times daily. Reported on 06/16/2015    . Insulin Glargine-Lixisenatide (SOLIQUA) 100-33 UNT-MCG/ML SOPN Inject 52 Units/day into the skin every morning.    Marland Kitchen lisinopril-hydrochlorothiazide (PRINZIDE,ZESTORETIC) 10-12.5 MG per tablet Take 1 tablet by mouth daily.    Marland Kitchen albuterol (PROVENTIL HFA;VENTOLIN HFA) 108 (90 Base) MCG/ACT inhaler Inhale 2 puffs into the lungs every 6 (six) hours as needed for wheezing or shortness of breath. (Patient not taking: Reported on 08/02/2016) 1 Inhaler 0   No current facility-administered medications on file  prior to visit.     No Known Allergies  Family History  Problem Relation Age of Onset  . Hypertension Mother   . Hypertension Sister   . Diabetes Paternal Grandmother     BP 122/76   Pulse (!) 112   Ht 5\' 5"  (1.651 m)   Wt 284 lb (128.8 kg)   SpO2 96%   BMI 47.26 kg/m   Review of Systems She denies hypoglycemia.  No weight change.      Objective:   Physical Exam VITAL SIGNS:  See vs page GENERAL: no distress Pulses: dorsalis pedis intact bilat.   MSK: no deformity of the feet CV: no leg edema Skin:  no ulcer on the feet.  normal color and temp on the feet.  Neuro: sensation is intact to touch on the feet.   Lab Results  Component Value Date   CREATININE 0.69 06/16/2015   BUN 9 06/16/2015   NA 139 06/16/2015   K 3.8 06/16/2015   CL 104 06/16/2015   CO2 26 06/16/2015      Assessment & Plan:  Insulin-requiring type 2 DM, with polyneuropathy: she needs increased rx Obesity: persistent HTN: well-controlled.  D/c lasix, as farxiga also has diuretic effect.   Patient Instructions  check your blood sugar twice a day.  vary the time of day when you check, between before the 3 meals, and at bedtime.  also check if you have  symptoms of your blood sugar being too high or too low.  please keep a record of the readings and bring it to your next appointment here (or you can bring the meter itself).  You can write it on any piece of paper.  please call us sooner if your blood sugar goes below 70, or if you have a lot of readings over 200.   Please continue the same soliqua and metformin (2/day), and: I have sent a prescription to your pharmacy, to add "farxiga."   Please call us next week, to tell us how the blood sugar is doing Please come back for a follow-up appointment in 1 month.   You should continue to pursue the weight loss surgery.

## 2016-08-04 ENCOUNTER — Telehealth: Payer: Self-pay | Admitting: Endocrinology

## 2016-08-04 NOTE — Telephone Encounter (Signed)
please call patient: I was working on your record.  I found that you should d/c furosemide, as farxiga will have the same effect.  I'll see you next time.

## 2016-08-06 NOTE — Telephone Encounter (Signed)
Requested a call back from the patient to discuss.  

## 2016-08-06 NOTE — Telephone Encounter (Signed)
I contacted the patient and advised of message. She voiced understanding and had no further questions at this time.  

## 2016-09-14 ENCOUNTER — Telehealth: Payer: Self-pay | Admitting: Internal Medicine

## 2016-09-14 NOTE — Telephone Encounter (Signed)
**  Remind patient they can make refill requests via MyChart**  Medication refill request (Name & Dosage):  Insulin Glargine-Lixisenatide (SOLIQUA) 100-33 UNT-MCG/ML SOPN dapagliflozin propanediol (FARXIGA) 5 MG TABS tablet  Preferred pharmacy (Name & Address):   [id number] - [place], [place] - [address]  Other comments (if applicable):   Insurance does not kick in until the first. Patient wants to know if there are samples available to carry her through until then?

## 2016-09-17 MED ORDER — DAPAGLIFLOZIN PROPANEDIOL 5 MG PO TABS: 5.0000 mg | ORAL_TABLET | Freq: Every day | ORAL | 11 refills | Status: DC

## 2016-09-17 MED ORDER — INSULIN GLARGINE-LIXISENATIDE 100-33 UNT-MCG/ML ~~LOC~~ SOPN: 52.0000 [IU]/d | PEN_INJECTOR | Freq: Every morning | SUBCUTANEOUS | 2 refills | Status: DC

## 2016-09-17 NOTE — Telephone Encounter (Signed)
Refill submitted. 

## 2016-09-19 ENCOUNTER — Telehealth: Payer: Self-pay

## 2016-09-19 NOTE — Telephone Encounter (Signed)
She can find out the cost of Lantus and Victoza to take separately

## 2016-09-19 NOTE — Telephone Encounter (Signed)
Until 09/30/2016 the patient has no insurance coverage. Victoza and Lantus would not be an option. 

## 2016-09-19 NOTE — Telephone Encounter (Signed)
Until 09/30/2016 the patient has no insurance coverage. Victoza and Lantus would not be an option.

## 2016-09-19 NOTE — Telephone Encounter (Signed)
Would Novolin N be an option?

## 2016-09-19 NOTE — Telephone Encounter (Signed)
Lumine called in with the patient on the other line. She stated at this time the patient is unable to afford [facility] (this would be 250$) and the farxiga would be 400$. Patient's insurance will go into effect on 09/30/2016 and she wanted to know if she should just continue the metformin at this time or if an alternative would be suggested?  Please advise.

## 2016-09-19 NOTE — Telephone Encounter (Signed)
MD responded on a separate telephone note and stated the patient could determine the cost of lantus and victoza. I advised these would not be an option either because the patient has no insurance coverage until 09/30/2016.

## 2016-09-19 NOTE — Telephone Encounter (Signed)
Advised patient per Aundra MilletMegan B that the least expensive script since she can not afford NigerSoliqua or Farxiga right now is $25 for a [facility] brand.  Advised also that Aundra MilletMegan B would send a message to Dr. Lucianne MussKumar to inquire whether or not she should continue to take Metformin.  Thank you,  -LL

## 2016-09-20 NOTE — Telephone Encounter (Signed)
Please address this message.

## 2016-09-20 NOTE — Telephone Encounter (Signed)
Please clarify message. Upon review of the chart the patient has not been on Novolin 70/30 insulin. Wanted to clarify if this is correct because the message states to continue this insulin.

## 2016-09-20 NOTE — Telephone Encounter (Signed)
She continues the [facility] brand 70/30 insulin, take 30 units in the morning and 20 before supper until seen

## 2016-09-21 NOTE — Telephone Encounter (Signed)
Requested a call back from the patient to further discuss.  

## 2016-09-21 NOTE — Telephone Encounter (Signed)
She should take Novolin 70/30 from [facility], 30 units before breakfast and 20 before supper

## 2016-09-21 NOTE — Telephone Encounter (Signed)
New insulin

## 2016-09-24 NOTE — Telephone Encounter (Signed)
Requested a call back from the patient to discuss further.  

## 2016-09-26 NOTE — Telephone Encounter (Signed)
After several failed attempts to reach the patient. Letter mailed to the patient requesting her to call the office back.

## 2016-09-28 ENCOUNTER — Ambulatory Visit: Payer: BLUE CROSS/BLUE SHIELD | Admitting: Endocrinology

## 2016-10-09 ENCOUNTER — Telehealth: Payer: Self-pay

## 2016-10-09 MED ORDER — DAPAGLIFLOZIN PROPANEDIOL 5 MG PO TABS: 5.0000 mg | ORAL_TABLET | Freq: Every day | ORAL | 2 refills | Status: DC

## 2016-10-09 MED ORDER — INSULIN GLARGINE-LIXISENATIDE 100-33 UNT-MCG/ML ~~LOC~~ SOPN: 52.0000 [IU]/d | PEN_INJECTOR | Freq: Every morning | SUBCUTANEOUS | 2 refills | Status: DC

## 2016-10-09 MED ORDER — METFORMIN HCL ER 500 MG PO TB24: 1000.0000 mg | ORAL_TABLET | Freq: Every day | ORAL | 2 refills | Status: DC

## 2016-10-09 NOTE — Telephone Encounter (Signed)
Patient called, stated that she needed to change from the local pharmacy to the mail order, but she is running out of medication and needs a supply in order to get the ones from the mail order the next refill time. I advised patient we would do a 3 month supply until she could get them from the mail order from now on.   Patient understood and would let us know if any changes needed to be made.

## 2016-10-16 ENCOUNTER — Other Ambulatory Visit: Payer: Self-pay

## 2016-10-16 MED ORDER — INSULIN GLARGINE-LIXISENATIDE 100-33 UNT-MCG/ML ~~LOC~~ SOPN: 52.0000 [IU]/d | PEN_INJECTOR | Freq: Every morning | SUBCUTANEOUS | 2 refills | Status: DC

## 2016-11-09 ENCOUNTER — Telehealth: Payer: Self-pay | Admitting: Endocrinology

## 2016-11-09 NOTE — Telephone Encounter (Signed)
MEDICATION: Insulin Glargine-Lixisenatide (SOLIQUA) 100-33 UNT-MCG/ML sopn  PHARMACY: [facility]     IS THIS A 90 DAY SUPPLY : yes  IS PATIENT OUT OF MEDICTAION: yes  IF NOT; HOW MUCH IS LEFT:  LAST APPOINTMENT DATE: 05/03  NEXT APPOINTMENT DATE: Just scheduled for 12/14/2016  OTHER COMMENTS:    **Let patient know to contact pharmacy at the end of the day to make sure medication is ready. **  ** Please notify patient to allow 48-72 hours to process**  **Encourage patient to contact the pharmacy for refills or they can [facility] through [facility]**

## 2016-11-11 ENCOUNTER — Emergency Department (HOSPITAL_COMMUNITY)
Admission: EM | Admit: 2016-11-11 | Discharge: 2016-11-12 | Disposition: A | Discharge status: Home / Self Care | Payer: 59 | Attending: Emergency Medicine | Admitting: Emergency Medicine

## 2016-11-11 ENCOUNTER — Encounter (HOSPITAL_COMMUNITY): Payer: Self-pay | Admitting: Nurse Practitioner

## 2016-11-11 DIAGNOSIS — R51 Headache: Secondary | ICD-10-CM | POA: Insufficient documentation

## 2016-11-11 DIAGNOSIS — Z794 Long term (current) use of insulin: Secondary | ICD-10-CM | POA: Insufficient documentation

## 2016-11-11 DIAGNOSIS — E119 Type 2 diabetes mellitus without complications: Secondary | ICD-10-CM | POA: Insufficient documentation

## 2016-11-11 DIAGNOSIS — F17210 Nicotine dependence, cigarettes, uncomplicated: Secondary | ICD-10-CM | POA: Diagnosis not present

## 2016-11-11 DIAGNOSIS — Z79899 Other long term (current) drug therapy: Secondary | ICD-10-CM | POA: Diagnosis not present

## 2016-11-11 DIAGNOSIS — R519 Headache, unspecified: Secondary | ICD-10-CM

## 2016-11-11 DIAGNOSIS — I10 Essential (primary) hypertension: Secondary | ICD-10-CM | POA: Insufficient documentation

## 2016-11-11 LAB — I-STAT CHEM 8, ED
BUN: 7 mg/dL (ref 6–20)
CHLORIDE: 97 mmol/L — AB (ref 101–111)
Calcium, Ion: 1.16 mmol/L (ref 1.15–1.40)
Creatinine, Ser: 0.6 mg/dL (ref 0.44–1.00)
GLUCOSE: 262 mg/dL — AB (ref 65–99)
HEMATOCRIT: 45 % (ref 36.0–46.0)
Hemoglobin: 15.3 g/dL — ABNORMAL HIGH (ref 12.0–15.0)
POTASSIUM: 3.2 mmol/L — AB (ref 3.5–5.1)
Sodium: 140 mmol/L (ref 135–145)
TCO2: 29 mmol/L (ref 0–100)

## 2016-11-11 MED ORDER — KETOROLAC TROMETHAMINE 30 MG/ML IJ SOLN
30.0000 mg | Freq: Once | INTRAMUSCULAR | Status: AC
  Administered 2016-11-11: 30 mg via INTRAVENOUS
  Filled 2016-11-11: qty 1

## 2016-11-11 MED ORDER — SODIUM CHLORIDE 0.9 % IV BOLUS (SEPSIS)
1000.0000 mL | Freq: Once | INTRAVENOUS | Status: AC
  Administered 2016-11-11: 1000 mL via INTRAVENOUS

## 2016-11-11 MED ORDER — DIPHENHYDRAMINE HCL 50 MG/ML IJ SOLN
25.0000 mg | Freq: Once | INTRAMUSCULAR | Status: AC
  Administered 2016-11-11: 25 mg via INTRAVENOUS
  Filled 2016-11-11: qty 1

## 2016-11-11 MED ORDER — METOCLOPRAMIDE HCL 5 MG/ML IJ SOLN
10.0000 mg | Freq: Once | INTRAMUSCULAR | Status: AC
  Administered 2016-11-11: 10 mg via INTRAVENOUS
  Filled 2016-11-11: qty 2

## 2016-11-11 NOTE — Discharge Instructions (Signed)
Please read instructions below. You can take advil every 6 hours as needed for headache. Drink plenty of water. Schedule an appointment with your primary care provider to discuss preventative options for your headache. Return to the ER for new or concerning symptoms.

## 2016-11-11 NOTE — ED Triage Notes (Signed)
Pt is c/o severe headache that started 2 days ago. Reports trying over the counter medications without getting any relief.

## 2016-11-11 NOTE — ED Provider Notes (Signed)
[facility] Provider Note   CSN: [id number] Arrival date & time: 11/11/16  1925     History   Chief Complaint Chief Complaint  Patient presents with  . Headache    HPI Jaime Daugherty is a 46 y.o. female presenting with gradual onset of frontal headache that began 2 days ago. Patient states headache worsened today, not relieved by Tylenol. States headache is similar to previous headaches, throbbing, associated with photophobia and intermittent nausea. Denies vision changes, photophobia, weakness, slurred speech, facial droop or any other symptoms today.  The history is provided by the patient.    Past Medical History:  Diagnosis Date  . Diabetes mellitus without complication (HCC)   . Hypertension     Patient Active Problem List   Diagnosis Date Noted  . Diabetes (HCC) 08/26/2013  . HTN (hypertension) 06/25/2011    Past Surgical History:  Procedure Laterality Date  . ABDOMINAL HYSTERECTOMY    . CHOLECYSTECTOMY      OB History    No data available       Home Medications    Prior to Admission medications   Medication Sig Start Date End Date Taking? Authorizing Provider  dapagliflozin propanediol (FARXIGA) 5 MG TABS tablet Take 5 mg by mouth daily. 10/09/16  Yes Romero Belling, MD  Insulin Glargine-Lixisenatide [facility]) 100-33 UNT-MCG/ML SOPN Inject 52 Units/day into the skin every morning. 10/16/16  Yes Romero Belling, MD  lisinopril-hydrochlorothiazide (PRINZIDE,ZESTORETIC) 10-12.5 MG per tablet Take 1 tablet by mouth daily.   Yes [provider]  metFORMIN (GLUCOPHAGE-XR) 500 MG 24 hr tablet Take 2 tablets (1,000 mg total) by mouth daily with breakfast. 10/09/16  Yes Romero Belling, MD  albuterol (PROVENTIL HFA;VENTOLIN HFA) 108 (90 Base) MCG/ACT inhaler Inhale 2 puffs into the lungs every 6 (six) hours as needed for wheezing or shortness of breath. Patient not taking: Reported on 08/02/2016 03/31/15   Lenell Antu, DO    Family History Family  History  Problem Relation Age of Onset  . Hypertension Mother   . Hypertension Sister   . Diabetes Paternal Grandmother     Social History Social History  Substance Use Topics  . Smoking status: Current Every Day Smoker    Types: Cigarettes  . Smokeless tobacco: Never Used  . Alcohol use No     Allergies   Patient has no known allergies.   Review of Systems Review of Systems  Constitutional: Negative for diaphoresis and fever.  HENT: Negative for rhinorrhea.   Eyes: Positive for photophobia. Negative for discharge and visual disturbance.  Respiratory: Negative for shortness of breath.   Cardiovascular: Negative for chest pain.  Gastrointestinal: Positive for nausea. Negative for abdominal pain and vomiting.  Genitourinary: Negative.   Musculoskeletal: Negative for neck pain.  Skin: Negative for color change.  Neurological: Positive for headaches. Negative for facial asymmetry, speech difficulty, weakness, light-headedness and numbness.  Psychiatric/Behavioral: Negative for confusion.     Physical Exam Updated Vital Signs BP (!) 149/83 (BP Location: Left Arm)   Pulse 92   Temp 98.8 F (37.1 C) (Oral)   Resp 18   SpO2 100%   Physical Exam  Constitutional: She is oriented to person, place, and time. She appears well-developed and well-nourished. No distress.  HENT:  Head: Normocephalic and atraumatic.  Mouth/Throat: Oropharynx is clear and moist.  Eyes: Pupils are equal, round, and reactive to light. Conjunctivae and EOM are normal.  Neck: Normal range of motion. Neck supple.  No nuchal rigidity  Cardiovascular: Normal  rate, regular rhythm, normal heart sounds and intact distal pulses.  Exam reveals no friction rub.   No murmur heard. Pulmonary/Chest: Effort normal and breath sounds normal. No respiratory distress. She has no wheezes. She has no rales.  Abdominal: Soft. Bowel sounds are normal. She exhibits no distension and no mass. There is no tenderness. There  is no rebound and no guarding.  Musculoskeletal: Normal range of motion.  Neurological: She is alert and oriented to person, place, and time. She displays normal reflexes. She exhibits normal muscle tone. Coordination normal.  No cranial nerve deficits. No facial droop or slurred speech. 5/5 strength bilateral upper and lower extremities.normal finger to nose and heel to shin. Normal gait. Normal sensation  Skin: Skin is warm.  Psychiatric: She has a normal mood and affect. Her behavior is normal.  Nursing note and vitals reviewed.    ED Treatments / Results  Labs (all labs ordered are listed, but only abnormal results are displayed) Labs Reviewed  I-STAT CHEM 8, ED - Abnormal; Notable for the following:       Result Value   Potassium 3.2 (*)    Chloride 97 (*)    Glucose, Bld 262 (*)    Hemoglobin 15.3 (*)    All other components within normal limits    EKG  EKG Interpretation None       Radiology No results found.  Procedures Procedures (including critical care time)  Medications Ordered in ED Medications  ketorolac (TORADOL) 30 MG/ML injection 30 mg (30 mg Intravenous Given 11/11/16 2249)  sodium chloride 0.9 % bolus 1,000 mL (1,000 mLs Intravenous New Bag/Given 11/11/16 2244)  metoCLOPramide (REGLAN) injection 10 mg (10 mg Intravenous Given 11/11/16 2244)  diphenhydrAMINE (BENADRYL) injection 25 mg (25 mg Intravenous Given 11/11/16 2244)     Initial Impression / Assessment and Plan / ED Course  I have reviewed the triage vital signs and the nursing notes.  Pertinent labs & imaging results that were available during my care of the patient were reviewed by me and considered in my medical decision making (see chart for details).     Pt with frontal HA. HA treated with toradol, reglan, and benadryl with improvement.  Presentation is like pts typical HA and non concerning [facility], ICH, Meningitis, or temporal arteritis. Pt is afebrile with no focal neuro deficits,  nuchal rigidity, or change in vision. Pt is to follow up with PCP to discuss prophylactic medication. Pt verbalizes understanding and is agreeable with plan to dc.   Patient discussed with and seen by Dr. Adriana Simasook.  Discussed results, findings, treatment and follow up. Patient advised of return precautions. Patient verbalized understanding and agreed with plan.  Final Clinical Impressions(s) / ED Diagnoses   Final diagnoses:  Bad headache    New Prescriptions New Prescriptions   No medications on file     Russo, [place] N, PA-C 11/12/16 0004    Donnetta Hutchingook, Brian, MD 11/13/16 1527

## 2016-11-12 ENCOUNTER — Other Ambulatory Visit: Payer: Self-pay

## 2016-11-12 MED ORDER — SOLIQUA 100-33 UNT-MCG/ML ~~LOC~~ SOPN: 52.0000 [IU]/d | PEN_INJECTOR | Freq: Every morning | SUBCUTANEOUS | 2 refills | Status: DC

## 2016-11-12 NOTE — Telephone Encounter (Signed)
Patient called in reference to note below stating [facility] said they needed a PA or she would have to pay $250 for Rx. Fax number [phone]  Please call patient with any questions. OK to leave message.

## 2016-11-12 NOTE — Telephone Encounter (Signed)
Called patient and notified her that prescription for Soliqua was sent to [facility]. She was worried she could not afford this medication anymore since her insurance has changed, so she will call back to let us know.

## 2016-12-14 ENCOUNTER — Ambulatory Visit (INDEPENDENT_AMBULATORY_CARE_PROVIDER_SITE_OTHER): Payer: 59 | Admitting: Endocrinology

## 2016-12-14 ENCOUNTER — Encounter: Payer: Self-pay | Admitting: Endocrinology

## 2016-12-14 VITALS — BP 122/70 | HR 96 | Wt 275.8 lb

## 2016-12-14 DIAGNOSIS — Z794 Long term (current) use of insulin: Secondary | ICD-10-CM

## 2016-12-14 DIAGNOSIS — E1142 Type 2 diabetes mellitus with diabetic polyneuropathy: Secondary | ICD-10-CM | POA: Diagnosis not present

## 2016-12-14 LAB — POCT GLYCOSYLATED HEMOGLOBIN (HGB A1C): Hemoglobin A1C: 9.2

## 2016-12-14 MED ORDER — INSULIN GLARGINE 100 UNIT/ML SOLOSTAR PEN: 30.0000 [IU] | PEN_INJECTOR | Freq: Every day | SUBCUTANEOUS | 99 refills | Status: DC

## 2016-12-14 MED ORDER — CANAGLIFLOZIN 100 MG PO TABS
100.0000 mg | ORAL_TABLET | Freq: Every day | ORAL | 3 refills | Status: DC
Start: 2016-12-14 — End: 2018-01-17

## 2016-12-14 MED ORDER — CANAGLIFLOZIN 100 MG PO TABS: 100.0000 mg | ORAL_TABLET | Freq: Every day | ORAL | 3 refills | Status: DC

## 2016-12-14 NOTE — Progress Notes (Signed)
Subjective:    Patient ID: Jaime Daugherty, female    DOB: [birth year], 46 y.o.   MRN: [id number]  HPI Pt returns for f/u of diabetes mellitus: DM type: Insulin-requiring type 2 Dx'ed: 2013 Complications: polyneuropathy Therapy: insulin since 2015; now soliqua and 2 oral meds.  GDM: never DKA: never Severe hypoglycemia: never Pancreatitis: never Other: she is on qd insulin, at least for now.  Interval history: no cbg record, but states cbg's vary from 200-300's.  pt states she feels well in general.  She ran out of the [place] a few weeks ago. She wants to minimize copays.   Past Medical History:  Diagnosis Date  . Diabetes mellitus without complication (HCC)   . Hypertension     Past Surgical History:  Procedure Laterality Date  . ABDOMINAL HYSTERECTOMY    . CHOLECYSTECTOMY      Social History   Social History  . Marital status: Single    Spouse name: N/A  . Number of children: N/A  . Years of education: N/A   Occupational History  . Not on file.   Social History Main Topics  . Smoking status: Current Every Day Smoker    Types: Cigarettes  . Smokeless tobacco: Never Used  . Alcohol use No  . Drug use: No  . Sexual activity: Not on file   Other Topics Concern  . Not on file   Social History Narrative  . No narrative on file    Current Outpatient Prescriptions on File Prior to Visit  Medication Sig Dispense Refill  . albuterol (PROVENTIL HFA;VENTOLIN HFA) 108 (90 Base) MCG/ACT inhaler Inhale 2 puffs into the lungs every 6 (six) hours as needed for wheezing or shortness of breath. 1 Inhaler 0  . lisinopril-hydrochlorothiazide (PRINZIDE,ZESTORETIC) 10-12.5 MG per tablet Take 1 tablet by mouth daily.    . metFORMIN (GLUCOPHAGE-XR) 500 MG 24 hr tablet Take 2 tablets (1,000 mg total) by mouth daily with breakfast. 60 tablet 2   No current facility-administered medications on file prior to visit.     No Known Allergies  Family History  Problem Relation Age  of Onset  . Hypertension Mother   . Hypertension Sister   . Diabetes Paternal Grandmother     BP 122/70   Pulse 96   Wt 275 lb 12.8 oz (125.1 kg)   SpO2 97%   BMI 45.90 kg/m   Review of Systems She denies hypoglycemia.     Objective:   Physical Exam VITAL SIGNS:  See vs page GENERAL: no distress Pulses: foot pulses are intact bilaterally.   MSK: no deformity of the feet or ankles.  CV: no edema of the legs or ankles Skin:  no ulcer on the feet or ankles.  normal color and temp on the feet and ankles.  Neuro: sensation is intact to touch on the feet and ankles.    Lab Results  Component Value Date   HGBA1C 9.2 12/14/2016       Assessment & Plan:  Insulin-requiring type 2 DM, with polyneuropathy: she needs increased rx Noncompliance with cbg recording: I'll work around this as best I can Obesity: slightly improved   Patient Instructions  check your blood sugar twice a day.  vary the time of day when you check, between before the 3 meals, and at bedtime.  also check if you have symptoms of your blood sugar being too high or too low.  please keep a record of the readings and bring it to your next  appointment here (or you can bring the meter itself).  You can write it on any piece of paper.  please call us sooner if your blood sugar goes below 70, or if you have a lot of readings over 200.   Please continue the same metformin, and:   Change farxiga to invokana, and: Change soliqa to lantus, 30 units each morning Please call or message [place] next week, to tell us how the blood sugar is doing.   Please come back for a follow-up appointment in 2 months.  You should continue to pursue the weight loss surgery.

## 2016-12-14 NOTE — Patient Instructions (Addendum)
check your blood sugar twice a day.  vary the time of day when you check, between before the 3 meals, and at bedtime.  also check if you have symptoms of your blood sugar being too high or too low.  please keep a record of the readings and bring it to your next appointment here (or you can bring the meter itself).  You can write it on any piece of paper.  please call us sooner if your blood sugar goes below 70, or if you have a lot of readings over 200.   Please continue the same metformin, and:   Change farxiga to invokana, and: Change soliqa to lantus, 30 units each morning Please call or message [place] next week, to tell us how the blood sugar is doing.   Please come back for a follow-up appointment in 2 months.  You should continue to pursue the weight loss surgery.

## 2017-01-04 ENCOUNTER — Other Ambulatory Visit: Payer: Self-pay

## 2017-01-04 MED ORDER — ALBUTEROL SULFATE HFA 108 (90 BASE) MCG/ACT IN AERS: 2.0000 | INHALATION_SPRAY | Freq: Four times a day (QID) | RESPIRATORY_TRACT | 0 refills | Status: DC | PRN

## 2017-01-04 MED ORDER — INSULIN GLARGINE 100 UNIT/ML SOLOSTAR PEN
30.0000 [IU] | PEN_INJECTOR | Freq: Every day | SUBCUTANEOUS | 99 refills | Status: DC
Start: 2017-01-04 — End: 2023-10-09

## 2017-03-18 ENCOUNTER — Other Ambulatory Visit: Payer: Self-pay | Admitting: Obstetrics and Gynecology

## 2017-03-18 DIAGNOSIS — Z1231 Encounter for screening mammogram for malignant neoplasm of breast: Secondary | ICD-10-CM

## 2017-03-20 ENCOUNTER — Ambulatory Visit: Payer: 59 | Admitting: Endocrinology

## 2017-04-02 ENCOUNTER — Other Ambulatory Visit: Payer: Self-pay

## 2017-04-02 ENCOUNTER — Encounter (HOSPITAL_COMMUNITY): Payer: Self-pay | Admitting: Emergency Medicine

## 2017-04-02 ENCOUNTER — Ambulatory Visit (HOSPITAL_COMMUNITY)
Admission: EM | Admit: 2017-04-02 | Discharge: 2017-04-02 | Disposition: A | Discharge status: Home / Self Care | Payer: BLUE CROSS/BLUE SHIELD | Attending: Family Medicine | Admitting: Family Medicine

## 2017-04-02 DIAGNOSIS — J069 Acute upper respiratory infection, unspecified: Secondary | ICD-10-CM | POA: Diagnosis not present

## 2017-04-02 MED ORDER — FLUTICASONE PROPIONATE 50 MCG/ACT NA SUSP
2.0000 | Freq: Every day | NASAL | 0 refills | Status: DC
Start: 2017-04-02 — End: 2018-10-08

## 2017-04-02 MED ORDER — CETIRIZINE HCL 10 MG PO TABS: 10.0000 mg | ORAL_TABLET | Freq: Every day | ORAL | 0 refills | Status: DC

## 2017-04-02 MED ORDER — DM-GUAIFENESIN ER 30-600 MG PO TB12: 1.0000 | ORAL_TABLET | Freq: Two times a day (BID) | ORAL | 0 refills | Status: AC

## 2017-04-02 NOTE — ED Provider Notes (Signed)
[facility]    CSN: [id number] Arrival date & time: 04/02/17  0957     History   Chief Complaint Chief Complaint  Patient presents with  . Nasal Congestion    HPI Jaime Daugherty is a 47 y.o. female with history of HTN and DM; Patient is presenting with URI symptoms- congestion, facial pressure and sore throat. Patient's main complaints are congestion and difficulty breathing at night. Denies cough. Symptoms have been going on for 3 days, worsened last night. Patient has tried Tylenol and cough syrup, with minimal relief. Denies fever, nausea, vomiting, diarrhea. Denies shortness of breath and chest pain.    HPI  Past Medical History:  Diagnosis Date  . Diabetes mellitus without complication (HCC)   . Hypertension     Patient Active Problem List   Diagnosis Date Noted  . Diabetes (HCC) 08/26/2013  . HTN (hypertension) 06/25/2011    Past Surgical History:  Procedure Laterality Date  . ABDOMINAL HYSTERECTOMY    . CHOLECYSTECTOMY      OB History    No data available       Home Medications    Prior to Admission medications   Medication Sig Start Date End Date Taking? Authorizing Provider  albuterol (PROVENTIL HFA;VENTOLIN HFA) 108 (90 Base) MCG/ACT inhaler Inhale 2 puffs into the lungs every 6 (six) hours as needed for wheezing or shortness of breath. 01/04/17   Romero Belling, MD  canagliflozin [facility]) 100 MG TABS tablet Take 1 tablet (100 mg total) by mouth daily before breakfast. 12/14/16   Romero Belling, MD  cetirizine (ZYRTEC) 10 MG tablet Take 1 tablet (10 mg total) by mouth daily for 14 days. 04/02/17 04/16/17  Rudie Rikard C, PA-C  dextromethorphan-guaiFENesin (MUCINEX DM) 30-600 MG 12hr tablet Take 1 tablet by mouth 2 (two) times daily for 7 days. 04/02/17 04/09/17  Azelea Seguin C, PA-C  fluticasone (FLONASE) 50 MCG/ACT nasal spray Place 2 sprays into both nostrils daily for 7 days. 04/02/17 04/09/17  Woodrow Dulski C, PA-C  Insulin Glargine (LANTUS  SOLOSTAR) 100 UNIT/ML Solostar Pen Inject 30 Units into the skin daily. 01/04/17   Romero Belling, MD  lisinopril-hydrochlorothiazide (PRINZIDE,ZESTORETIC) 10-12.5 MG per tablet Take 1 tablet by mouth daily.    [provider]  metFORMIN (GLUCOPHAGE-XR) 500 MG 24 hr tablet Take 2 tablets (1,000 mg total) by mouth daily with breakfast. 10/09/16   Romero Belling, MD    Family History Family History  Problem Relation Age of Onset  . Hypertension Mother   . Hypertension Sister   . Diabetes Paternal Grandmother     Social History Social History   Tobacco Use  . Smoking status: Current Every Day Smoker    Types: Cigarettes  . Smokeless tobacco: Never Used  Substance Use Topics  . Alcohol use: No    Alcohol/week: 0.0 oz  . Drug use: No     Allergies   Patient has no known allergies.   Review of Systems Review of Systems  Constitutional: Negative for chills, fatigue and fever.  HENT: Positive for congestion, ear pain, rhinorrhea, sinus pressure and sore throat. Negative for trouble swallowing.   Respiratory: Negative for cough, chest tightness and shortness of breath.   Cardiovascular: Negative for chest pain.  Gastrointestinal: Negative for abdominal pain, nausea and vomiting.  Musculoskeletal: Negative for myalgias.  Skin: Negative for rash.  Neurological: Negative for dizziness, light-headedness and headaches.  All other systems reviewed and are negative.    Physical Exam Triage Vital Signs ED  Triage Vitals  Enc Vitals Group     BP      Pulse      Resp      Temp      Temp src      SpO2      Weight      Height      Head Circumference      Peak Flow      Pain Score      Pain Loc      Pain Edu?      Excl. in GC?    No data found.  Updated Vital Signs BP 140/80 (BP Location: Right Arm)   Pulse 97   Temp 99.6 F (37.6 C) (Oral)   Resp 20   SpO2 98%    Physical Exam  Constitutional: She appears well-developed and well-nourished. No distress.    HENT:  Head: Normocephalic and atraumatic.  Right Ear: Tympanic membrane and ear canal normal.  Left Ear: Tympanic membrane and ear canal normal.  Nose: Rhinorrhea present.  Mouth/Throat: Uvula is midline and mucous membranes are normal. No oral lesions. No trismus in the jaw. No uvula swelling. Posterior oropharyngeal erythema present. No tonsillar exudate.  Eyes: Conjunctivae are normal.  Neck: Neck supple.  Cardiovascular: Normal rate and regular rhythm.  No murmur heard. Pulmonary/Chest: Effort normal and breath sounds normal. No respiratory distress.  Abdominal: Soft. There is no tenderness.  Musculoskeletal: She exhibits no edema.  Neurological: She is alert.  Skin: Skin is warm and dry.  Psychiatric: She has a normal mood and affect.  Nursing note and vitals reviewed.    UC Treatments / Results  Labs (all labs ordered are listed, but only abnormal results are displayed) Labs Reviewed - No data to display  EKG  EKG Interpretation None       Radiology No results found.  Procedures Procedures (including critical care time)  Medications Ordered in UC Medications - No data to display   Initial Impression / Assessment and Plan / UC Course  I have reviewed the triage vital signs and the nursing notes.  Pertinent labs & imaging results that were available during my care of the patient were reviewed by me and considered in my medical decision making (see chart for details).     Patient presents with symptoms likely from a viral upper respiratory infection. Differential includes bacterial pneumonia, sinusitis, allergic rhinitis, acute bronchitis. Do not suspect underlying cardiopulmonary process. Symptoms seem unlikely related to ACS, CHF or COPD exacerbations, pneumonia, pneumothorax. Patient is nontoxic appearing and not in need of emergent medical intervention.  Given zyrtec, flonase and mucinex DM for congestion.  Lung CTABL. Recommended symptom control with  over the counter medications: Daily oral anti-histamine, Oral decongestant or IN corticosteroid, saline irrigations, cepacol lozenges, Robitussin, Delsym, honey tea.  Return if symptoms fail to improve in 1-2 weeks or you develop shortness of breath, chest pain, severe headache. Patient states understanding and is agreeable.   Final Clinical Impressions(s) / UC Diagnoses   Final diagnoses:  Viral URI    ED Discharge Orders        Ordered    cetirizine (ZYRTEC) 10 MG tablet  Daily     04/02/17 1031    fluticasone (FLONASE) 50 MCG/ACT nasal spray  Daily     04/02/17 1031    dextromethorphan-guaiFENesin (MUCINEX DM) 30-600 MG 12hr tablet  2 times daily     04/02/17 1031       Controlled Substance Prescriptions [place] Controlled  Substance Registry consulted? Not Applicable   Lew Dawes, [place] 04/02/17 1100

## 2017-04-02 NOTE — Discharge Instructions (Signed)
You likely having a viral upper respiratory infection. We recommended symptom control. I expect your symptoms to start improving in the next 1-2 weeks.   1. Take a daily allergy pill/anti-histamine like Zyrtec, Claritin, or Store brand consistently for 2 weeks  2. For congestion you may try an oral decongestant like Mucinex or sudafed. You may also try intranasal flonase nasal spray or saline irrigations (neti pot, sinus cleanse)  3. For your sore throat you may try cepacol lozenges, salt water gargles, throat spray. Treatment of congestion may also help your sore throat.  4. For cough you may try Robitussen, Mucinex DM  5. Take Tylenol or Ibuprofen to help with pain/inflammation  6. Stay hydrated, drink plenty of fluids to keep throat coated and less irritated  Honey Tea For cough/sore throat try using a honey-based tea. Use 3 teaspoons of honey with juice squeezed from half lemon. Place shaved pieces of ginger into 1/2-1 cup of water and warm over stove top. Then mix the ingredients and repeat every 4 hours as needed.  Please return if you are worsening, not improving in 1 week, develop nausea, vomiting, shortness of breath, chest pain.

## 2017-04-02 NOTE — ED Triage Notes (Signed)
Pt c/o stuffy nose x3 days.

## 2017-05-06 ENCOUNTER — Ambulatory Visit
Admission: RE | Admit: 2017-05-06 | Discharge: 2017-05-06 | Disposition: A | Discharge status: Home / Self Care | Payer: BLUE CROSS/BLUE SHIELD | Source: Ambulatory Visit | Attending: Obstetrics and Gynecology | Admitting: Obstetrics and Gynecology

## 2017-05-06 DIAGNOSIS — Z1231 Encounter for screening mammogram for malignant neoplasm of breast: Secondary | ICD-10-CM

## 2018-01-17 ENCOUNTER — Other Ambulatory Visit: Payer: Self-pay

## 2018-01-17 ENCOUNTER — Ambulatory Visit (HOSPITAL_COMMUNITY)
Admission: EM | Admit: 2018-01-17 | Discharge: 2018-01-17 | Disposition: A | Discharge status: Home / Self Care | Payer: BLUE CROSS/BLUE SHIELD | Attending: Family Medicine | Admitting: Family Medicine

## 2018-01-17 ENCOUNTER — Encounter (HOSPITAL_COMMUNITY): Payer: Self-pay | Admitting: Emergency Medicine

## 2018-01-17 DIAGNOSIS — M5442 Lumbago with sciatica, left side: Secondary | ICD-10-CM

## 2018-01-17 DIAGNOSIS — L02415 Cutaneous abscess of right lower limb: Secondary | ICD-10-CM

## 2018-01-17 DIAGNOSIS — M5441 Lumbago with sciatica, right side: Secondary | ICD-10-CM

## 2018-01-17 DIAGNOSIS — G8929 Other chronic pain: Secondary | ICD-10-CM | POA: Diagnosis not present

## 2018-01-17 MED ORDER — KETOROLAC TROMETHAMINE 30 MG/ML IJ SOLN
30.0000 mg | Freq: Once | INTRAMUSCULAR | Status: AC
  Administered 2018-01-17: 30 mg via INTRAMUSCULAR

## 2018-01-17 MED ORDER — KETOROLAC TROMETHAMINE 30 MG/ML IJ SOLN
INTRAMUSCULAR | Status: AC
  Filled 2018-01-17: qty 1

## 2018-01-17 MED ORDER — IBUPROFEN 800 MG PO TABS: 800.0000 mg | ORAL_TABLET | Freq: Three times a day (TID) | ORAL | 0 refills | Status: DC

## 2018-01-17 MED ORDER — CYCLOBENZAPRINE HCL 10 MG PO TABS: 10.0000 mg | ORAL_TABLET | Freq: Two times a day (BID) | ORAL | 0 refills | Status: DC | PRN

## 2018-01-17 MED ORDER — CEPHALEXIN 500 MG PO CAPS: 500.0000 mg | ORAL_CAPSULE | Freq: Four times a day (QID) | ORAL | 0 refills | Status: DC

## 2018-01-17 NOTE — ED Triage Notes (Signed)
Pt reports lower back pain that radiates into the back of both of her legs that has been going on for five months.  She also reports an open, draining abscess to her right groin that started two days ago.

## 2018-01-17 NOTE — ED Provider Notes (Signed)
[facility]    CSN: [id number] Arrival date & time: 01/17/18  1654     History   Chief Complaint Chief Complaint  Patient presents with  . Back Pain  . Abscess    right groin    HPI Jaime Daugherty is a 47 y.o. female.   HPI  Patient is here with 2 medical problems.  Her she has chronic intermittent low back pain.  She states that it has been going on for several months.  Some days worse than others.  No accident, no injury, no heavy lifting.  No prior x-rays.  She states that since all across her low back.  With certain movements it will radiate down the back of both of her thighs to the knee.  No bowel or bladder problems.  No falls.  No numbness or weakness. Patient also states that she has an abscess.  It is in her upper thigh/groin area.  It was large and painful.  This morning it ruptured.  She worries because she is diabetic.  There is a large area of red skin around the opening.  Past Medical History:  Diagnosis Date  . Diabetes mellitus without complication (HCC)   . Hypertension     Patient Active Problem List   Diagnosis Date Noted  . Diabetes (HCC) 08/26/2013  . HTN (hypertension) 06/25/2011    Past Surgical History:  Procedure Laterality Date  . ABDOMINAL HYSTERECTOMY    . CHOLECYSTECTOMY      OB History   None      Home Medications    Prior to Admission medications   Medication Sig Start Date End Date Taking? Authorizing Provider  furosemide (LASIX) 40 MG tablet Take 40 mg by mouth every other day. 11/02/17  Yes [provider]  Insulin Glargine (LANTUS SOLOSTAR) 100 UNIT/ML Solostar Pen Inject 30 Units into the skin daily. 01/04/17  Yes Romero Belling, MD  Liraglutide (VICTOZA [place]) Inject into the skin.   Yes [provider]  lisinopril-hydrochlorothiazide (PRINZIDE,ZESTORETIC) 10-12.5 MG per tablet Take 1 tablet by mouth daily.   Yes [provider]  albuterol (PROVENTIL HFA;VENTOLIN HFA) 108 (90 Base) MCG/ACT  inhaler Inhale 2 puffs into the lungs every 6 (six) hours as needed for wheezing or shortness of breath. 01/04/17   Romero Belling, MD  cephALEXin (KEFLEX) 500 MG capsule Take 1 capsule (500 mg total) by mouth 4 (four) times daily. 01/17/18   Eustace Moore, MD  cetirizine (ZYRTEC) 10 MG tablet Take 1 tablet (10 mg total) by mouth daily for 14 days. 04/02/17 04/16/17  Wieters, Hallie C, PA-C  cyclobenzaprine (FLEXERIL) 10 MG tablet Take 1 tablet (10 mg total) by mouth 2 (two) times daily as needed for muscle spasms. 01/17/18   Eustace Moore, MD  fluticasone (FLONASE) 50 MCG/ACT nasal spray Place 2 sprays into both nostrils daily for 7 days. 04/02/17 04/09/17  Wieters, Hallie C, PA-C  ibuprofen (ADVIL,MOTRIN) 800 MG tablet Take 1 tablet (800 mg total) by mouth 3 (three) times daily. 01/17/18   Eustace Moore, MD    Family History Family History  Problem Relation Age of Onset  . Hypertension Mother   . Hypertension Sister   . Diabetes Paternal Grandmother     Social History Social History   Tobacco Use  . Smoking status: Current Every Day Smoker    Types: Cigarettes  . Smokeless tobacco: Never Used  Substance Use Topics  . Alcohol use: No    Alcohol/week: 0.0 standard  drinks  . Drug use: No     Allergies   Patient has no known allergies.   Review of Systems Review of Systems  Constitutional: Negative for chills and fever.  HENT: Negative for ear pain and sore throat.   Eyes: Negative for pain and visual disturbance.  Respiratory: Negative for cough and shortness of breath.   Cardiovascular: Negative for chest pain and palpitations.  Gastrointestinal: Negative for abdominal pain and vomiting.  Genitourinary: Negative for dysuria and hematuria.  Musculoskeletal: Positive for back pain. Negative for arthralgias.  Skin: Positive for wound. Negative for color change and rash.  Neurological: Negative for seizures and syncope.  All other systems reviewed and are  negative.    Physical Exam Triage Vital Signs ED Triage Vitals  Enc Vitals Group     BP 01/17/18 1713 (!) 137/97     Pulse Rate 01/17/18 1713 94     Resp --      Temp 01/17/18 1713 98.1 F (36.7 C)     Temp Source 01/17/18 1713 Oral     SpO2 01/17/18 1713 100 %     Weight --      Height --      Head Circumference --      Peak Flow --      Pain Score 01/17/18 1710 8     Pain Loc --      Pain Edu? --      Excl. in GC? --    No data found.  Updated Vital Signs BP (!) 137/97 (BP Location: Right Wrist)   Pulse 94   Temp 98.1 F (36.7 C) (Oral)   SpO2 100%      Physical Exam  Constitutional: She appears well-developed and well-nourished. She appears distressed.  Appears uncomfortable  HENT:  Head: Normocephalic and atraumatic.  Mouth/Throat: Oropharynx is clear and moist.  Eyes: Pupils are equal, round, and reactive to light. Conjunctivae are normal.  Neck: Normal range of motion.  Cardiovascular: Normal rate, regular rhythm and normal heart sounds.  Pulmonary/Chest: Effort normal and breath sounds normal. No respiratory distress.  Abdominal: Soft. Bowel sounds are normal. She exhibits no distension. There is no tenderness.  Musculoskeletal: Normal range of motion. She exhibits no edema.  Back range of motion is slow but full.  No tenderness of the lumbar muscles.  No tenderness of the bony structures or SI joints.  Straight leg raise is negative bilaterally at full extension, mild posterior thigh pain.  Reflexes are 2+ and equal at the knee and ankle.  Neurological: She is alert.  Skin: Skin is warm and dry.  In the crease of the right groin and upper thigh, there is a 3 mm punctate opening.  Scant yellow drainage.  3 cm of surrounding erythema.  Mild induration.  Psychiatric: She has a normal mood and affect. Her behavior is normal.  Vitals reviewed.    UC Treatments / Results  Labs (all labs ordered are listed, but only abnormal results are displayed) Labs  Reviewed - No data to display  EKG None  Radiology No results found.  Procedures Procedures (including critical care time)  Medications Ordered in UC Medications  ketorolac (TORADOL) 30 MG/ML injection 30 mg (30 mg Intramuscular Given 01/17/18 1742)    Initial Impression / Assessment and Plan / UC Course  I have reviewed the triage vital signs and the nursing notes.  Pertinent labs & imaging results that were available during my care of the patient were reviewed by me  and considered in my medical decision making (see chart for details).    Since the abscesses ruptured it will probably resolve on its own.  I am worried about the redness in a diabetic and it given her a few days of Keflex.  Also advised warm compresses. For her chronic low back pain she needs to go back to her primary care doctor.  They will decide if she needs x-rays.  I reassured her that I think it is mechanical low back pain.  With treatment, physical therapy, medicines and should improve. Final Clinical Impressions(s) / UC Diagnoses   Final diagnoses:  Chronic bilateral low back pain with bilateral sciatica  Abscess of right thigh     Discharge Instructions     Take ibuprofen 3 times a day with food This is for pain and inflammation Take cyclobenzaprine as needed for muscle relaxer This may help you sleep Take the antibiotic 3 times a day for 7 days This is for the infection in your thigh Avoid bending and lifting activities until your back feels better Follow-up with your primary care doctor next week   ED Prescriptions    Medication Sig Dispense Auth. Provider   cephALEXin (KEFLEX) 500 MG capsule Take 1 capsule (500 mg total) by mouth 4 (four) times daily. 20 capsule Eustace Moore, MD   cyclobenzaprine (FLEXERIL) 10 MG tablet Take 1 tablet (10 mg total) by mouth 2 (two) times daily as needed for muscle spasms. 20 tablet Eustace Moore, MD   ibuprofen (ADVIL,MOTRIN) 800 MG tablet Take 1  tablet (800 mg total) by mouth 3 (three) times daily. 21 tablet Eustace Moore, MD     Controlled Substance Prescriptions [place] Controlled Substance Registry consulted? Not Applicable   Eustace Moore, MD 01/17/18 2102

## 2018-01-17 NOTE — Discharge Instructions (Addendum)
Take ibuprofen 3 times a day with food This is for pain and inflammation Take cyclobenzaprine as needed for muscle relaxer This may help you sleep Take the antibiotic 3 times a day for 7 days This is for the infection in your thigh Avoid bending and lifting activities until your back feels better Follow-up with your primary care doctor next week

## 2018-02-06 ENCOUNTER — Ambulatory Visit (HOSPITAL_COMMUNITY)
Admission: EM | Admit: 2018-02-06 | Discharge: 2018-02-06 | Disposition: A | Discharge status: Home / Self Care | Payer: BLUE CROSS/BLUE SHIELD | Attending: Family Medicine | Admitting: Family Medicine

## 2018-02-06 ENCOUNTER — Ambulatory Visit (INDEPENDENT_AMBULATORY_CARE_PROVIDER_SITE_OTHER): Payer: BLUE CROSS/BLUE SHIELD

## 2018-02-06 ENCOUNTER — Encounter (HOSPITAL_COMMUNITY): Payer: Self-pay | Admitting: Emergency Medicine

## 2018-02-06 ENCOUNTER — Other Ambulatory Visit: Payer: Self-pay

## 2018-02-06 DIAGNOSIS — R0602 Shortness of breath: Secondary | ICD-10-CM | POA: Diagnosis not present

## 2018-02-06 DIAGNOSIS — E119 Type 2 diabetes mellitus without complications: Secondary | ICD-10-CM

## 2018-02-06 DIAGNOSIS — R05 Cough: Secondary | ICD-10-CM | POA: Diagnosis not present

## 2018-02-06 DIAGNOSIS — I10 Essential (primary) hypertension: Secondary | ICD-10-CM

## 2018-02-06 DIAGNOSIS — J40 Bronchitis, not specified as acute or chronic: Secondary | ICD-10-CM | POA: Diagnosis not present

## 2018-02-06 DIAGNOSIS — E1165 Type 2 diabetes mellitus with hyperglycemia: Secondary | ICD-10-CM

## 2018-02-06 LAB — GLUCOSE, CAPILLARY: GLUCOSE-CAPILLARY: 184 mg/dL — AB (ref 70–99)

## 2018-02-06 MED ORDER — ALBUTEROL SULFATE HFA 108 (90 BASE) MCG/ACT IN AERS: 2.0000 | INHALATION_SPRAY | Freq: Four times a day (QID) | RESPIRATORY_TRACT | 0 refills | Status: AC | PRN

## 2018-02-06 MED ORDER — SPACER/AERO-HOLD CHAMBER BAGS MISC: 1.0000 | 1 refills | Status: DC | PRN

## 2018-02-06 MED ORDER — IPRATROPIUM-ALBUTEROL 0.5-2.5 (3) MG/3ML IN SOLN
3.0000 mL | Freq: Once | RESPIRATORY_TRACT | Status: AC
  Administered 2018-02-06: 3 mL via RESPIRATORY_TRACT

## 2018-02-06 MED ORDER — IPRATROPIUM-ALBUTEROL 0.5-2.5 (3) MG/3ML IN SOLN
RESPIRATORY_TRACT | Status: AC
  Filled 2018-02-06: qty 3

## 2018-02-06 MED ORDER — HYDROCODONE-HOMATROPINE 5-1.5 MG/5ML PO SYRP: 5.0000 mL | ORAL_SOLUTION | Freq: Four times a day (QID) | ORAL | 0 refills | Status: DC | PRN

## 2018-02-06 NOTE — Discharge Instructions (Addendum)
If shortness of breath worsens, go to the emergency department

## 2018-02-06 NOTE — ED Notes (Signed)
Pt initially 92% on RA when brought into [facility]. After vitals completed pt o2 96%.

## 2018-02-06 NOTE — ED Provider Notes (Signed)
[facility]    CSN: [id number] Arrival date & time: 02/06/18  4098     History   Chief Complaint Chief Complaint  Patient presents with  . Cough  . Shortness of Breath    HPI Jaime Daugherty is a 47 y.o. female.   Pt reports returning from a cruise to the [place] on Monday.  She woke up Tuesday with a cough.  Pt is here today for SOB along with the cough.  Pt's eyes are red and watering.  No fever.  Patient has had to use an inhaler and in the past.  She is never been diagnosed with asthma.  She does have a history of smoking in the past.  Patient works at [employer]  Patient notes that along with the shortness of breath and cough, she has some substernal chest pain and diffuse myalgias.  Her cough is productive although there is no blood.  She has had no nausea or vomiting.  Patient is a diabetic and has not checked her sugar in several days.     Past Medical History:  Diagnosis Date  . Diabetes mellitus without complication (HCC)   . Hypertension     Patient Active Problem List   Diagnosis Date Noted  . Diabetes (HCC) 08/26/2013  . HTN (hypertension) 06/25/2011    Past Surgical History:  Procedure Laterality Date  . ABDOMINAL HYSTERECTOMY    . CHOLECYSTECTOMY      OB History   None      Home Medications    Prior to Admission medications   Medication Sig Start Date End Date Taking? Authorizing Provider  furosemide (LASIX) 40 MG tablet Take 40 mg by mouth every other day. 11/02/17  Yes [provider]  Insulin Glargine (LANTUS SOLOSTAR) 100 UNIT/ML Solostar Pen Inject 30 Units into the skin daily. 01/04/17  Yes Romero Belling, MD  Liraglutide (VICTOZA [place]) Inject into the skin.   Yes [provider]  lisinopril-hydrochlorothiazide (PRINZIDE,ZESTORETIC) 10-12.5 MG per tablet Take 1 tablet by mouth daily.   Yes [provider]  albuterol (PROVENTIL HFA;VENTOLIN HFA) 108 (90 Base) MCG/ACT inhaler Inhale 2 puffs into the  lungs every 6 (six) hours as needed for wheezing or shortness of breath. 02/06/18   Elvina Sidle, MD  cetirizine (ZYRTEC) 10 MG tablet Take 1 tablet (10 mg total) by mouth daily for 14 days. 04/02/17 04/16/17  Wieters, Hallie C, PA-C  cyclobenzaprine (FLEXERIL) 10 MG tablet Take 1 tablet (10 mg total) by mouth 2 (two) times daily as needed for muscle spasms. 01/17/18   Eustace Moore, MD  fluticasone (FLONASE) 50 MCG/ACT nasal spray Place 2 sprays into both nostrils daily for 7 days. 04/02/17 04/09/17  Wieters, Hallie C, PA-C  HYDROcodone-homatropine (HYDROMET) 5-1.5 MG/5ML syrup Take 5 mLs by mouth every 6 (six) hours as needed for cough. 02/06/18   Elvina Sidle, MD  ibuprofen (ADVIL,MOTRIN) 800 MG tablet Take 1 tablet (800 mg total) by mouth 3 (three) times daily. 01/17/18   Eustace Moore, MD  Spacer/Aero-Hold Chamber Bags MISC 1 Device by Does not apply route every 4 (four) hours as needed. 02/06/18   Elvina Sidle, MD    Family History Family History  Problem Relation Age of Onset  . Hypertension Mother   . Hypertension Sister   . Diabetes Paternal Grandmother     Social History Social History   Tobacco Use  . Smoking status: Current Every Day Smoker    Types: Cigarettes  . Smokeless tobacco: Never  Used  Substance Use Topics  . Alcohol use: No    Alcohol/week: 0.0 standard drinks  . Drug use: No     Allergies   Patient has no known allergies.   Review of Systems Review of Systems   Physical Exam Triage Vital Signs ED Triage Vitals  Enc Vitals Group     BP 02/06/18 0820 (!) 164/97     Pulse Rate 02/06/18 0820 (!) 122     Resp 02/06/18 0820 (!) 22     Temp 02/06/18 0820 98.3 F (36.8 C)     Temp Source 02/06/18 0820 Oral     SpO2 02/06/18 0820 92 %     Weight --      Height --      Head Circumference --      Peak Flow --      Pain Score 02/06/18 0831 0     Pain Loc --      Pain Edu? --      Excl. in GC? --    No data found.  Updated Vital  Signs BP (!) 164/97 (BP Location: Left Arm)   Pulse (!) 107   Temp 98.3 F (36.8 C) (Oral)   Resp (!) 22   SpO2 96%    Physical Exam  Constitutional: She is oriented to person, place, and time. She appears well-developed and well-nourished. She appears ill. She appears distressed.  HENT:  Head: Normocephalic.  Mouth/Throat: Oropharynx is clear and moist.  Eyes: Pupils are equal, round, and reactive to light.  Neck: Normal range of motion. Neck supple.  Cardiovascular: Normal rate, regular rhythm and normal heart sounds.  Pulmonary/Chest: Tachypnea noted. She has rales in the right upper field, the right middle field, the right lower field, the left upper field, the left middle field and the left lower field.  Abdominal: Soft.  Musculoskeletal: Normal range of motion.       Right lower leg: She exhibits edema. She exhibits no tenderness.       Left lower leg: She exhibits edema. She exhibits no tenderness.  Neurological: She is alert and oriented to person, place, and time.  Skin: Skin is warm and dry.  Psychiatric: She has a normal mood and affect. Her behavior is normal.  Nursing note and vitals reviewed.    UC Treatments / Results  Labs (all labs ordered are listed, but only abnormal results are displayed) Labs Reviewed - No data to display  EKG None  Radiology No results found.  Procedures Procedures (including critical care time)  Medications Ordered in UC Medications  ipratropium-albuterol (DUONEB) 0.5-2.5 (3) MG/3ML nebulizer solution 3 mL (3 mLs Nebulization Given 02/06/18 0909)    Initial Impression / Assessment and Plan / UC Course  I have reviewed the triage vital signs and the nursing notes.  Pertinent labs & imaging results that were available during my care of the patient were reviewed by me and considered in my medical decision making (see chart for details).    Final Clinical Impressions(s) / UC Diagnoses   Final diagnoses:  Bronchitis   Shortness of breath  Uncontrolled type 2 diabetes mellitus with hyperglycemia [facility])     Discharge Instructions     If shortness of breath worsens, go to the emergency department    ED Prescriptions    Medication Sig Dispense Auth. Provider   albuterol (PROVENTIL HFA;VENTOLIN HFA) 108 (90 Base) MCG/ACT inhaler Inhale 2 puffs into the lungs every 6 (six) hours as needed for wheezing or  shortness of breath. 1 Inhaler Elvina Sidle, MD   Spacer/Aero-Hold Chamber Bags MISC 1 Device by Does not apply route every 4 (four) hours as needed. 1 each Elvina Sidle, MD   HYDROcodone-homatropine (HYDROMET) 5-1.5 MG/5ML syrup Take 5 mLs by mouth every 6 (six) hours as needed for cough. 60 mL Elvina Sidle, MD     Controlled Substance Prescriptions [place] Controlled Substance Registry consulted? Not Applicable   Elvina Sidle, MD 02/06/18 1010

## 2018-02-06 NOTE — ED Triage Notes (Signed)
Pt reports returning from a cruise to the [place] on Monday.  She woke up Tuesday with a cough.  Pt is here today for SOB along with the cough.  Pt's eyes are red and watering.  No fever.

## 2018-06-05 ENCOUNTER — Ambulatory Visit
Admission: RE | Admit: 2018-06-05 | Discharge: 2018-06-05 | Disposition: A | Discharge status: Home / Self Care | Payer: BLUE CROSS/BLUE SHIELD | Source: Ambulatory Visit | Attending: Internal Medicine | Admitting: Internal Medicine

## 2018-06-05 ENCOUNTER — Other Ambulatory Visit: Payer: Self-pay | Admitting: Internal Medicine

## 2018-06-05 DIAGNOSIS — D729 Disorder of white blood cells, unspecified: Secondary | ICD-10-CM

## 2018-06-05 DIAGNOSIS — R1032 Left lower quadrant pain: Secondary | ICD-10-CM

## 2018-06-05 MED ORDER — IOPAMIDOL (ISOVUE-300) INJECTION 61%
125.0000 mL | Freq: Once | INTRAVENOUS | Status: AC | PRN
  Administered 2018-06-05: 125 mL via INTRAVENOUS

## 2018-06-25 ENCOUNTER — Ambulatory Visit: Payer: Self-pay | Admitting: Cardiology

## 2018-08-22 ENCOUNTER — Encounter: Payer: Self-pay | Admitting: Cardiology

## 2018-08-22 ENCOUNTER — Ambulatory Visit: Payer: BLUE CROSS/BLUE SHIELD | Admitting: Cardiology

## 2018-08-22 VITALS — BP 155/86 | HR 100 | Ht 65.0 in | Wt 275.9 lb

## 2018-08-22 DIAGNOSIS — E1165 Type 2 diabetes mellitus with hyperglycemia: Secondary | ICD-10-CM | POA: Diagnosis not present

## 2018-08-22 DIAGNOSIS — I10 Essential (primary) hypertension: Secondary | ICD-10-CM | POA: Diagnosis not present

## 2018-08-22 DIAGNOSIS — R079 Chest pain, unspecified: Secondary | ICD-10-CM

## 2018-08-22 DIAGNOSIS — R0789 Other chest pain: Secondary | ICD-10-CM | POA: Diagnosis not present

## 2018-08-22 HISTORY — DX: Chest pain, unspecified: R07.9

## 2018-08-22 MED ORDER — ASPIRIN EC 81 MG PO TBEC: 81.0000 mg | DELAYED_RELEASE_TABLET | Freq: Every day | ORAL | 2 refills | Status: DC

## 2018-08-22 MED ORDER — ROSUVASTATIN CALCIUM 20 MG PO TABS: 20.0000 mg | ORAL_TABLET | Freq: Every day | ORAL | 3 refills | Status: DC

## 2018-08-22 NOTE — Progress Notes (Signed)
Patient referred by Jaime Panda, MD for abnormal EKG, chest pain  Subjective:   Jaime Daugherty, female    DOB: 23-Aug-1970, 48 y.o.   MRN: [id number]   Chief Complaint  Patient presents with  . Hypertension  . Chest Pain  . New Patient (Initial Visit)    HPI  48 y.o. African-American female with hypertension, uncontrolled type 2 diabetes mellitus, referred for evaluation of abnormal EKG and chest pain.  Patient lives fairly sedentary lifestyle and does not do any regular exercise.  She has noticed episodes of chest pain lasting for up to 2 days, central, sharp, unrelated to exertion.  Her EKG in March as well as now shows unchanged T wave inversions.  She denies any shortness of breath with limited physical activity.  Also denies any orthopnea, PND, leg edema.  Blood pressure is elevated today, states that it is usually lower than this.  Past Medical History:  Diagnosis Date  . Diabetes mellitus without complication ([place])   . Hypertension     Past Surgical History:  Procedure Laterality Date  . ABDOMINAL HYSTERECTOMY    . CHOLECYSTECTOMY      Social History   Socioeconomic History  . Marital status: Single    Spouse name: Not on file  . Number of children: Not on file  . Years of education: Not on file  . Highest education level: Not on file  Occupational History  . Not on file  Social Needs  . Financial resource strain: Not on file  . Food insecurity:    Worry: Not on file    Inability: Not on file  . Transportation needs:    Medical: Not on file    Non-medical: Not on file  Tobacco Use  . Smoking status: Current Every Day Smoker    Types: Cigarettes  . Smokeless tobacco: Never Used  Substance and Sexual Activity  . Alcohol use: No    Alcohol/week: 0.0 standard drinks  . Drug use: No  . Sexual activity: Not on file  Lifestyle  . Physical activity:    Days per week: Not on file    Minutes per session: Not on file  . Stress: Not on file   Relationships  . Social connections:    Talks on phone: Not on file    Gets together: Not on file    Attends religious service: Not on file    Active member of club or organization: Not on file    Attends meetings of clubs or organizations: Not on file    Relationship status: Not on file  . Intimate partner violence:    Fear of current or ex partner: Not on file    Emotionally abused: Not on file    Physically abused: Not on file    Forced sexual activity: Not on file  Other Topics Concern  . Not on file  Social History Narrative  . Not on file    Family History  Problem Relation Age of Onset  . Hypertension Mother   . Hypertension Sister   . Diabetes Paternal Grandmother     Current Outpatient Medications on File Prior to Visit  Medication Sig Dispense Refill  . albuterol (PROVENTIL HFA;VENTOLIN HFA) 108 (90 Base) MCG/ACT inhaler Inhale 2 puffs into the lungs every 6 (six) hours as needed for wheezing or shortness of breath. 1 Inhaler 0  . cetirizine (ZYRTEC) 10 MG tablet Take 1 tablet (10 mg total) by mouth daily for 14 days. 14 tablet  0  . cyclobenzaprine (FLEXERIL) 10 MG tablet Take 1 tablet (10 mg total) by mouth 2 (two) times daily as needed for muscle spasms. 20 tablet 0  . fluticasone (FLONASE) 50 MCG/ACT nasal spray Place 2 sprays into both nostrils daily for 7 days. 1 g 0  . furosemide (LASIX) 40 MG tablet Take 40 mg by mouth every other day.  6  . HYDROcodone-homatropine (HYDROMET) 5-1.5 MG/5ML syrup Take 5 mLs by mouth every 6 (six) hours as needed for cough. 60 mL 0  . ibuprofen (ADVIL,MOTRIN) 800 MG tablet Take 1 tablet (800 mg total) by mouth 3 (three) times daily. 21 tablet 0  . Insulin Glargine (LANTUS SOLOSTAR) 100 UNIT/ML Solostar Pen Inject 30 Units into the skin daily. 10 pen PRN  . Liraglutide (VICTOZA [place]) Inject into the skin.    Marland Kitchen lisinopril-hydrochlorothiazide (PRINZIDE,ZESTORETIC) 10-12.5 MG per tablet Take 1 tablet by mouth daily.    Marland Kitchen  Spacer/Aero-Hold Chamber Bags MISC 1 Device by Does not apply route every 4 (four) hours as needed. 1 each 1   No current facility-administered medications on file prior to visit.     Cardiovascular studies:  EKG 08/22/2018: Sinus rhythm 100 bpm.  Inferolateral T wave inversions. Consider ishcemia. Unchanged compared to outside EKG in 06/2018   Recent labs: 05/21/2018: Glucose 113.  BUN/creatinine 6/0.66.  EGFR normal.  Sodium 142, potassium 3.4. H/H 14/45.  MCV 70.  Platelets 228 Cholesterol 183, triglycerides 128, HDL 37, LDL 120 Hemoglobin A1c 11.7% TSH normal  Review of Systems  Constitution: Negative for decreased appetite, malaise/fatigue, weight gain and weight loss.  HENT: Negative for congestion.   Eyes: Negative for visual disturbance.  Cardiovascular: Positive for chest pain. Negative for dyspnea on exertion, leg swelling, palpitations and syncope.  Respiratory: Negative for cough.   Endocrine: Negative for cold intolerance.  Hematologic/Lymphatic: Does not bruise/bleed easily.  Skin: Negative for itching and rash.  Musculoskeletal: Negative for myalgias.  Gastrointestinal: Negative for abdominal pain, nausea and vomiting.  Genitourinary: Negative for dysuria.  Neurological: Negative for dizziness and weakness.  Psychiatric/Behavioral: The patient is not nervous/anxious.   All other systems reviewed and are negative.        Vitals:   08/22/18 1051 08/22/18 1106  BP: (!) 161/91 (!) 155/86  Pulse: (!) 105 100  SpO2: 96%    Filed Weights   08/22/18 1051  Weight: 125.1 kg    Objective:   Physical Exam  Constitutional: She is oriented to person, place, and time. She appears well-developed and well-nourished. No distress.  Moderately obese  HENT:  Head: Normocephalic and atraumatic.  Eyes: Pupils are equal, round, and reactive to light. Conjunctivae are normal.  Neck: No JVD present.  Cardiovascular: Normal rate, regular rhythm and intact distal  pulses.  Pulmonary/Chest: Effort normal and breath sounds normal. She has no wheezes. She has no rales.  Abdominal: Soft. Bowel sounds are normal. There is no rebound.  Musculoskeletal:        General: No edema.  Lymphadenopathy:    She has no cervical adenopathy.  Neurological: She is alert and oriented to person, place, and time. No cranial nerve deficit.  Skin: Skin is warm and dry.  Psychiatric: She has a normal mood and affect.  Nursing note and vitals reviewed.         Assessment & Recommendations:   48 y.o. African-American female with hypertension, uncontrolled type 2 diabetes mellitus, hyperlipidemia, obesity referred for evaluation of abnormal EKG and chest pain.  1. Atypical chest  pain Plan her chest pain is atypical in etiology, she has significant risk factors with uncontrolled hypertension, type 2 diabetes mellitus, and hyperlipidemia.  Recommend nuclear stress test.  2. Uncontrolled type 2 diabetes mellitus with hyperglycemia ([place]) Counseled the patient on low carbohydrate diet, as well as calorie control.  Patient has fairly unhealthy diet which includes soda, ice tea etc.  Continue management as per PCP.  In absence of any significant bleeding issues, and risk factors for CAD, now started on aspirin and rosuvastatin.  3. Essential hypertension Blood pressure usually lower at baseline, according to the patient.  Continue current antihypertensive therapy.    4. Morbid obesity ([place]) Counseled regarding weight loss.  Follow-up virtual visit after the above tests.    Thank you for referring the patient to [place]. Please feel free to contact with any questions.  Nigel Mormon, MD [facility] Pager: [phone] Office: [phone] If no answer Cell [phone]

## 2018-08-23 ENCOUNTER — Encounter: Payer: Self-pay | Admitting: Cardiology

## 2018-08-23 DIAGNOSIS — E1165 Type 2 diabetes mellitus with hyperglycemia: Secondary | ICD-10-CM | POA: Insufficient documentation

## 2018-08-29 ENCOUNTER — Ambulatory Visit: Payer: Self-pay | Admitting: Cardiology

## 2018-09-05 ENCOUNTER — Other Ambulatory Visit: Payer: Self-pay | Admitting: Obstetrics and Gynecology

## 2018-09-05 DIAGNOSIS — Z1231 Encounter for screening mammogram for malignant neoplasm of breast: Secondary | ICD-10-CM

## 2018-09-29 ENCOUNTER — Ambulatory Visit (INDEPENDENT_AMBULATORY_CARE_PROVIDER_SITE_OTHER): Payer: BC Managed Care – PPO

## 2018-09-29 ENCOUNTER — Other Ambulatory Visit: Payer: Self-pay

## 2018-09-29 ENCOUNTER — Other Ambulatory Visit: Payer: BLUE CROSS/BLUE SHIELD

## 2018-09-29 DIAGNOSIS — R0789 Other chest pain: Secondary | ICD-10-CM

## 2018-09-29 NOTE — Progress Notes (Signed)
LMOM with details

## 2018-10-08 ENCOUNTER — Encounter: Payer: Self-pay | Admitting: Cardiology

## 2018-10-08 ENCOUNTER — Ambulatory Visit: Payer: BC Managed Care – PPO | Admitting: Cardiology

## 2018-10-08 ENCOUNTER — Other Ambulatory Visit: Payer: Self-pay

## 2018-10-08 VITALS — BP 126/80

## 2018-10-08 DIAGNOSIS — R0789 Other chest pain: Secondary | ICD-10-CM | POA: Diagnosis not present

## 2018-10-08 DIAGNOSIS — E1165 Type 2 diabetes mellitus with hyperglycemia: Secondary | ICD-10-CM

## 2018-10-08 DIAGNOSIS — I10 Essential (primary) hypertension: Secondary | ICD-10-CM

## 2018-10-08 NOTE — Progress Notes (Signed)
Patient referred by Jaime Panda, MD for abnormal EKG, chest pain  Subjective:   Jaime Daugherty, female    DOB: [birth year], 48 y.o.   MRN: [id number]   Chief Complaint  Patient presents with  . Chest Pain    HPI  48 y.o. African-American female with hypertension, uncontrolled type 2 diabetes mellitus, referred for evaluation of abnormal EKG and chest pain.  Stress test showed no ischemia. Patient has not had any more chest pain episodes.  Past Medical History:  Diagnosis Date  . Chest pain of uncertain etiology 07/19/6220  . Diabetes mellitus without complication ([place])   . Hypertension     Past Surgical History:  Procedure Laterality Date  . ABDOMINAL HYSTERECTOMY    . CHOLECYSTECTOMY      Social History   Socioeconomic History  . Marital status: Single    Spouse name: Not on file  . Number of children: 0  . Years of education: Not on file  . Highest education level: Not on file  Occupational History  . Not on file  Social Needs  . Financial resource strain: Not on file  . Food insecurity    Worry: Not on file    Inability: Not on file  . Transportation needs    Medical: Not on file    Non-medical: Not on file  Tobacco Use  . Smoking status: Former Smoker    Packs/day: 0.50    Types: Cigarettes    Quit date: 01/2018    Years since quitting: 0.6  . Smokeless tobacco: Never Used  Substance and Sexual Activity  . Alcohol use: Yes    Alcohol/week: 0.0 standard drinks    Comment: socially  . Drug use: No  . Sexual activity: Not on file  Lifestyle  . Physical activity    Days per week: Not on file    Minutes per session: Not on file  . Stress: Not on file  Relationships  . Social Herbalist on phone: Not on file    Gets together: Not on file    Attends religious service: Not on file    Active member of club or organization: Not on file    Attends meetings of clubs or organizations: Not on file    Relationship status: Not on file  .  Intimate partner violence    Fear of current or ex partner: Not on file    Emotionally abused: Not on file    Physically abused: Not on file    Forced sexual activity: Not on file  Other Topics Concern  . Not on file  Social History Narrative  . Not on file    Family History  Problem Relation Age of Onset  . Hypertension Mother   . Hypertension Sister   . Diabetes Paternal Grandmother     Current Outpatient Medications on File Prior to Visit  Medication Sig Dispense Refill  . albuterol (PROVENTIL HFA;VENTOLIN HFA) 108 (90 Base) MCG/ACT inhaler Inhale 2 puffs into the lungs every 6 (six) hours as needed for wheezing or shortness of breath. 1 Inhaler 0  . aspirin EC 81 MG tablet Take 1 tablet (81 mg total) by mouth daily. 30 tablet 2  . cetirizine (ZYRTEC) 10 MG tablet Take 1 tablet (10 mg total) by mouth daily for 14 days. 14 tablet 0  . fluticasone (FLONASE) 50 MCG/ACT nasal spray Place 2 sprays into both nostrils daily for 7 days. 1 g 0  . furosemide (LASIX) 40  MG tablet Take 40 mg by mouth every other day.  6  . Insulin Glargine (LANTUS SOLOSTAR) 100 UNIT/ML Solostar Pen Inject 30 Units into the skin daily. (Patient taking differently: Inject into the skin daily. 60-62 units) 10 pen PRN  . Liraglutide (VICTOZA [place]) Inject into the skin daily. 1.8 units    . lisinopril-hydrochlorothiazide (ZESTORETIC) 20-12.5 MG tablet Take 1 tablet by mouth daily.     . potassium chloride (K-DUR) 10 MEQ tablet Take 10 mEq by mouth daily.    . rosuvastatin (CRESTOR) 20 MG tablet Take 1 tablet (20 mg total) by mouth at bedtime. 30 tablet 3  . Spacer/Aero-Hold Chamber Bags MISC 1 Device by Does not apply route every 4 (four) hours as needed. 1 each 1  . sucralfate (CARAFATE) 1 GM/10ML suspension Take 1 g by mouth daily as needed.     No current facility-administered medications on file prior to visit.     Cardiovascular studies:  Lexiscan Sestamibi stress test 09/29/2018: Lexiscan stress test was  performed. Stress EKG is non-diagnostic, as this is pharmacological stress test. Pharmacological myocardial perfusion imaging is normal. LVEF is normal. Low risk study.  EKG 08/22/2018: Sinus rhythm 100 bpm.  Inferolateral T wave inversions. Consider ishcemia. Unchanged compared to outside EKG in 06/2018  Recent labs: 05/21/2018: Glucose 113.  BUN/creatinine 6/0.66.  EGFR normal.  Sodium 142, potassium 3.4. H/H 14/45.  MCV 70.  Platelets 228 Cholesterol 183, triglycerides 128, HDL 37, LDL 120 Hemoglobin A1c 11.7% TSH normal  Review of Systems  Constitution: Negative for decreased appetite, malaise/fatigue, weight gain and weight loss.  HENT: Negative for congestion.   Eyes: Negative for visual disturbance.  Cardiovascular: Negative for chest pain, dyspnea on exertion, leg swelling, palpitations and syncope.  Respiratory: Negative for cough.   Endocrine: Negative for cold intolerance.  Hematologic/Lymphatic: Does not bruise/bleed easily.  Skin: Negative for itching and rash.  Musculoskeletal: Negative for myalgias.  Gastrointestinal: Negative for abdominal pain, nausea and vomiting.  Genitourinary: Negative for dysuria.  Neurological: Negative for dizziness and weakness.  Psychiatric/Behavioral: The patient is not nervous/anxious.   All other systems reviewed and are negative.        There were no vitals filed for this visit. There were no vitals filed for this visit.  Objective:   Physical Exam  Constitutional: She is oriented to person, place, and time. She appears well-developed and well-nourished. No distress.  Pulmonary/Chest: Effort normal.  Neurological: She is alert and oriented to person, place, and time.  Psychiatric: She has a normal mood and affect.  Nursing note and vitals reviewed.         Assessment & Recommendations:   48 y.o. African-American female with hypertension, uncontrolled type 2 diabetes mellitus, hyperlipidemia, obesity referred for  evaluation of abnormal EKG and chest pain.  1. Atypical chest pain Stress test shows no ischemia. Okay not to be on aspirin.  2. Uncontrolled type 2 diabetes mellitus with hyperglycemia ([place]) Continue f/u w/PCP. Continue statin.  3. Essential hypertension Well controlled.   I will see her on as needed basis.   Nigel Mormon, MD [facility] Pager: [phone] Office: [phone] If no answer Cell [phone]

## 2018-10-17 ENCOUNTER — Ambulatory Visit: Payer: BLUE CROSS/BLUE SHIELD | Admitting: Cardiology

## 2018-11-07 ENCOUNTER — Ambulatory Visit
Admission: RE | Admit: 2018-11-07 | Discharge: 2018-11-07 | Disposition: A | Discharge status: Home / Self Care | Payer: BC Managed Care – PPO | Source: Ambulatory Visit | Attending: Obstetrics and Gynecology | Admitting: Obstetrics and Gynecology

## 2018-11-07 ENCOUNTER — Other Ambulatory Visit: Payer: Self-pay

## 2018-11-07 DIAGNOSIS — Z1231 Encounter for screening mammogram for malignant neoplasm of breast: Secondary | ICD-10-CM

## 2018-12-10 ENCOUNTER — Ambulatory Visit
Admission: RE | Admit: 2018-12-10 | Discharge: 2018-12-10 | Disposition: A | Discharge status: Home / Self Care | Payer: BC Managed Care – PPO | Source: Ambulatory Visit | Attending: Internal Medicine | Admitting: Internal Medicine

## 2018-12-10 ENCOUNTER — Other Ambulatory Visit: Payer: Self-pay | Admitting: Internal Medicine

## 2018-12-10 ENCOUNTER — Other Ambulatory Visit: Payer: Self-pay

## 2018-12-10 DIAGNOSIS — M79674 Pain in right toe(s): Secondary | ICD-10-CM

## 2019-04-25 ENCOUNTER — Other Ambulatory Visit: Payer: Self-pay

## 2019-04-25 ENCOUNTER — Ambulatory Visit (HOSPITAL_COMMUNITY)
Admission: EM | Admit: 2019-04-25 | Discharge: 2019-04-25 | Disposition: A | Discharge status: Home / Self Care | Payer: BC Managed Care – PPO | Attending: Family Medicine | Admitting: Family Medicine

## 2019-04-25 ENCOUNTER — Encounter (HOSPITAL_COMMUNITY): Payer: Self-pay

## 2019-04-25 DIAGNOSIS — Z20822 Contact with and (suspected) exposure to covid-19: Secondary | ICD-10-CM | POA: Diagnosis present

## 2019-04-25 DIAGNOSIS — R0981 Nasal congestion: Secondary | ICD-10-CM | POA: Diagnosis present

## 2019-04-25 DIAGNOSIS — R519 Headache, unspecified: Secondary | ICD-10-CM | POA: Diagnosis present

## 2019-04-25 NOTE — ED Triage Notes (Signed)
Pt states recently exposed to co-worker tested positive for COVID today. Pt states had chills last night, minor infrequent cough, sinus congestion.  Denies, SOB, body aches, abd pain, n/v/d, HA, sore throat.  Able to speak full sentences w/o difficulty.

## 2019-04-25 NOTE — ED Provider Notes (Signed)
[place]    CSN: [id number] Arrival date & time: 04/25/19  1425      History   Chief Complaint Chief Complaint  Patient presents with  . Other  . Nasal Congestion    HPI Jaime Daugherty is a 49 y.o. female.   HPI  Jaime Daugherty presents for COVID-19 testing following an exposure to a co-worker with that tested positive for COVID-19. Low grade fever today. Headache intermittently since last night and endorses nasal congestion. Mild occasional cough. Has not taken any medication today. Has not checked blood sugar today. Poor appetite and not drinking much fluids. Denies chest pain, dizziness, shortness of breath, or weakness. Past Medical History:  Diagnosis Date  . Chest pain of uncertain etiology 0/27/7412  . Diabetes mellitus without complication ([place])   . Hypertension     Patient Active Problem List   Diagnosis Date Noted  . Uncontrolled type 2 diabetes mellitus with hyperglycemia ([place]) 08/23/2018  . Morbid obesity ([place]) 08/23/2018  . Chest pain of uncertain etiology 87/86/7672  . Diabetes ([place]) 08/26/2013  . Essential hypertension 06/25/2011    Past Surgical History:  Procedure Laterality Date  . ABDOMINAL HYSTERECTOMY    . CHOLECYSTECTOMY      OB History   No obstetric history on file.      Home Medications    Prior to Admission medications   Medication Sig Start Date End Date Taking? Authorizing Provider  furosemide (LASIX) 40 MG tablet Take 40 mg by mouth every other day. 11/02/17  Yes [provider]  Insulin Glargine (LANTUS SOLOSTAR) 100 UNIT/ML Solostar Pen Inject 30 Units into the skin daily. Patient taking differently: Inject 60 Units into the skin daily.  01/04/17  Yes Renato Shin, MD  lisinopril-hydrochlorothiazide (ZESTORETIC) 20-12.5 MG tablet Take 1 tablet by mouth daily.    Yes [provider]  potassium chloride (K-DUR) 10 MEQ tablet Take 10 mEq by mouth daily.   Yes [provider]  rosuvastatin  (CRESTOR) 20 MG tablet Take 1 tablet (20 mg total) by mouth at bedtime. 08/22/18 08/22/19 Yes Patwardhan, Manish J, MD  Semaglutide (RYBELSUS) 14 MG TABS Take by mouth.   Yes [provider]  albuterol (PROVENTIL HFA;VENTOLIN HFA) 108 (90 Base) MCG/ACT inhaler Inhale 2 puffs into the lungs every 6 (six) hours as needed for wheezing or shortness of breath. 02/06/18   Robyn Haber, MD  Liraglutide (VICTOZA [place]) Inject into the skin daily. 1.8 units    [provider]    Family History Family History  Problem Relation Age of Onset  . Hypertension Mother   . Hypertension Sister   . Diabetes Paternal Grandmother   . Breast cancer Maternal Aunt     Social History Social History   Tobacco Use  . Smoking status: Former Smoker    Packs/day: 0.50    Types: Cigarettes    Quit date: 01/2018    Years since quitting: 1.2  . Smokeless tobacco: Never Used  Substance Use Topics  . Alcohol use: Yes    Alcohol/week: 0.0 standard drinks    Comment: socially  . Drug use: No     Allergies   Patient has no known allergies.   Review of Systems Review of Systems Pertinent negatives listed in HPI  Physical Exam Triage Vital Signs ED Triage Vitals  Enc Vitals Group     BP 04/25/19 1706 136/63     Pulse Rate 04/25/19 1706 99     Resp 04/25/19 1706 20  Temp 04/25/19 1706 99 F (37.2 C)     Temp Source 04/25/19 1706 Oral     SpO2 04/25/19 1706 100 %     Weight --      Height --      Head Circumference --      Peak Flow --      Pain Score 04/25/19 1700 0     Pain Loc --      Pain Edu? --      Excl. in [place]? --    No data found.  Updated Vital Signs BP 136/63 (BP Location: Left Arm)   Pulse 99   Temp 99 F (37.2 C) (Oral)   Resp 20   SpO2 100%   Visual Acuity Right Eye Distance:   Left Eye Distance:   Bilateral Distance:    Right Eye Near:   Left Eye Near:    Bilateral Near:     Physical Exam General appearance: alert, well developed, well  nourished, cooperative and in no distress Head: Normocephalic, without obvious abnormality, atraumatic Respiratory: Respirations even and unlabored, normal respiratory rate Heart: rate and rhythm normal. No gallop or murmurs noted on exam  Abdomen: BS +, no distention, no rebound tenderness. Extremities: No gross deformities Skin: Skin color, texture, turgor normal. No rashes seen  Psych: Appropriate mood and affect. Neurologic: Alert, oriented to person, place, and time, thought content appropriate. UC Treatments / Results  Labs (all labs ordered are listed, but only abnormal results are displayed) Labs Reviewed - No data to display  EKG   Radiology No results found.  Procedures Procedures (including critical care time)  Medications Ordered in UC Medications - No data to display  Initial Impression / Assessment and Plan / UC Course  I have reviewed the triage vital signs and the nursing notes.  Pertinent labs & imaging results that were available during my care of the patient were reviewed by me and considered in my medical decision making (see chart for details).   Encounter for COVID-19 testing due to recent exposure.  COVID-19 test pending. Work note provided for 72 hours to allow time for test to result. Patient encouraged to self isolate while test is pending. Encourage hydration and rest. Final Clinical Impressions(s) / UC Diagnoses   Final diagnoses:  Close exposure to COVID-19 virus  Nasal congestion  Nonintractable headache, unspecified chronicity pattern, unspecified headache type   Discharge Instructions   None    ED Prescriptions    None     PDMP not reviewed this encounter.   Bing Neighbors, [place] 04/27/19 [phone]

## 2019-04-26 LAB — NOVEL CORONAVIRUS, NAA (HOSP ORDER, SEND-OUT TO REF LAB; TAT 18-24 HRS): SARS-CoV-2, NAA: NOT DETECTED

## 2019-06-07 ENCOUNTER — Ambulatory Visit: Payer: BC Managed Care – PPO | Attending: Internal Medicine

## 2019-06-07 DIAGNOSIS — Z23 Encounter for immunization: Secondary | ICD-10-CM | POA: Insufficient documentation

## 2019-06-07 NOTE — Progress Notes (Signed)
   Covid-19 Vaccination Clinic  Name:  Jaime Daugherty    MRN: [id number] DOB: October 31, 1970  06/07/2019  [name] was observed post Covid-19 immunization for 15 minutes without incident. She was provided with Vaccine Information Sheet and instruction to access the V-Safe system.   [name] was instructed to call 911 with any severe reactions post vaccine: Marland Kitchen Difficulty breathing  . Swelling of face and throat  . A fast heartbeat  . A bad rash all over body  . Dizziness and weakness   Immunizations Administered    Name Date Dose VIS Date Route   Pfizer COVID-19 Vaccine 06/07/2019  9:14 AM 0.3 mL 03/13/2019 Intramuscular   Manufacturer: [employer], [employer]   Lot: [id number]   NDC: [address]-9

## 2019-06-30 ENCOUNTER — Ambulatory Visit: Payer: BC Managed Care – PPO | Attending: Internal Medicine

## 2019-06-30 DIAGNOSIS — Z23 Encounter for immunization: Secondary | ICD-10-CM

## 2019-06-30 NOTE — Progress Notes (Signed)
   Covid-19 Vaccination Clinic  Name:  Jaime Daugherty    MRN: [id number] DOB: 22-Apr-1970  06/30/2019  Jaime Daugherty was observed post Covid-19 immunization for 15 minutes without incident. She was provided with Vaccine Information Sheet and instruction to access the V-Safe system.   Jaime Daugherty was instructed to call 911 with any severe reactions post vaccine: Marland Kitchen Difficulty breathing  . Swelling of face and throat  . A fast heartbeat  . A bad rash all over body  . Dizziness and weakness   Immunizations Administered    Name Date Dose VIS Date Route   Pfizer COVID-19 Vaccine 06/30/2019  4:05 PM 0.3 mL 03/13/2019 Intramuscular   Manufacturer: [employer], [employer]   Lot: [id number]   NDC: [address]-5

## 2019-09-23 ENCOUNTER — Ambulatory Visit (HOSPITAL_COMMUNITY)
Admission: EM | Admit: 2019-09-23 | Discharge: 2019-09-23 | Disposition: A | Discharge status: Home / Self Care | Payer: BC Managed Care – PPO | Attending: Internal Medicine | Admitting: Internal Medicine

## 2019-09-23 ENCOUNTER — Other Ambulatory Visit: Payer: Self-pay

## 2019-09-23 ENCOUNTER — Encounter (HOSPITAL_COMMUNITY): Payer: Self-pay

## 2019-09-23 DIAGNOSIS — M5432 Sciatica, left side: Secondary | ICD-10-CM | POA: Diagnosis not present

## 2019-09-23 MED ORDER — TRAMADOL HCL 50 MG PO TABS: 50.0000 mg | ORAL_TABLET | Freq: Every evening | ORAL | 0 refills | Status: DC | PRN

## 2019-09-23 MED ORDER — KETOROLAC TROMETHAMINE 30 MG/ML IJ SOLN
INTRAMUSCULAR | Status: AC
  Filled 2019-09-23: qty 1

## 2019-09-23 MED ORDER — PREDNISONE 20 MG PO TABS: 40.0000 mg | ORAL_TABLET | Freq: Every day | ORAL | 0 refills | Status: AC

## 2019-09-23 MED ORDER — KETOROLAC TROMETHAMINE 30 MG/ML IJ SOLN
30.0000 mg | Freq: Once | INTRAMUSCULAR | Status: AC
  Administered 2019-09-23: 30 mg via INTRAMUSCULAR

## 2019-09-23 NOTE — ED Provider Notes (Signed)
[facility]    CSN: [id number] Arrival date & time: 09/23/19  1421      History   Chief Complaint Chief Complaint  Patient presents with  . Leg Pain    HPI [name] is a 49 y.o. female with a history of diabetes mellitus type 2 comes to urgent care with complaints of left leg pain which started 4 days ago.  Patient describes pain as sharp, radiates down the left leg to the foot.  No known relieving factors.  It is aggravated by trying to raise her leg while sitting in certain positions.  Patient denies any trauma to the leg.  Patient has numbness in both feet.  No weakness in the left leg.  Patient is able to bear weight. HPI  Past Medical History:  Diagnosis Date  . Chest pain of uncertain etiology 08/22/2018  . Diabetes mellitus without complication ([facility])   . Hypertension     Patient Active Problem List   Diagnosis Date Noted  . Uncontrolled type 2 diabetes mellitus with hyperglycemia ([facility]) 08/23/2018  . Morbid obesity ([facility]) 08/23/2018  . Chest pain of uncertain etiology 08/22/2018  . Diabetes ([facility]) 08/26/2013  . Essential hypertension 06/25/2011    Past Surgical History:  Procedure Laterality Date  . ABDOMINAL HYSTERECTOMY    . CHOLECYSTECTOMY      OB History   No obstetric history on file.      Home Medications    Prior to Admission medications   Medication Sig Start Date End Date Taking? Authorizing Provider  albuterol (PROVENTIL HFA;VENTOLIN HFA) 108 (90 Base) MCG/ACT inhaler Inhale 2 puffs into the lungs every 6 (six) hours as needed for wheezing or shortness of breath. 02/06/18   Elvina Sidle, MD  furosemide (LASIX) 40 MG tablet Take 40 mg by mouth every other day. 11/02/17   [provider]  Insulin Glargine (LANTUS SOLOSTAR) 100 UNIT/ML Solostar Pen Inject 30 Units into the skin daily. Patient taking differently: Inject 60 Units into the skin daily.  01/04/17   Romero Belling, MD  Liraglutide (VICTOZA [place]) Inject into the skin  daily. 1.8 units    [provider]  lisinopril-hydrochlorothiazide (ZESTORETIC) 20-12.5 MG tablet Take 1 tablet by mouth daily.     [provider]  potassium chloride (K-DUR) 10 MEQ tablet Take 10 mEq by mouth daily.    [provider]  predniSONE (DELTASONE) 20 MG tablet Take 2 tablets (40 mg total) by mouth daily for 5 days. 09/23/19 09/28/19  Merrilee Jansky, MD  rosuvastatin (CRESTOR) 20 MG tablet Take 1 tablet (20 mg total) by mouth at bedtime. 08/22/18 08/22/19  Patwardhan, Anabel Bene, MD  Semaglutide (RYBELSUS) 14 MG TABS Take by mouth.    [provider]  traMADol (ULTRAM) 50 MG tablet Take 1 tablet (50 mg total) by mouth at bedtime as needed for moderate pain or severe pain. 09/23/19   Larron Armor, Britta Mccreedy, MD    Family History Family History  Problem Relation Age of Onset  . Hypertension Mother   . Hypertension Sister   . Diabetes Paternal Grandmother   . Breast cancer Maternal Aunt     Social History Social History   Tobacco Use  . Smoking status: Former Smoker    Packs/day: 0.50    Types: Cigarettes    Quit date: 01/2018    Years since quitting: 1.6  . Smokeless tobacco: Never Used  Vaping Use  . Vaping Use: Never used  Substance Use Topics  .  Alcohol use: Yes    Alcohol/week: 0.0 standard drinks    Comment: socially  . Drug use: No     Allergies   Patient has no known allergies.   Review of Systems Review of Systems  Constitutional: Negative.   HENT: Negative.   Respiratory: Negative.   Musculoskeletal: Positive for arthralgias and back pain. Negative for joint swelling, myalgias, neck pain and neck stiffness.  Skin: Negative.   Neurological: Positive for numbness. Negative for dizziness, weakness and headaches.     Physical Exam Triage Vital Signs ED Triage Vitals  Enc Vitals Group     BP 09/23/19 1522 (!) 152/77     Pulse Rate 09/23/19 1522 92     Resp 09/23/19 1522 14     Temp 09/23/19 1522 98.5 F (36.9 C)      Temp src --      SpO2 09/23/19 1522 100 %     Weight --      Height --      Head Circumference --      Peak Flow --      Pain Score 09/23/19 1521 7     Pain Loc --      Pain Edu? --      Excl. in [place]? --    No data found.  Updated Vital Signs BP (!) 152/77   Pulse 92   Temp 98.5 F (36.9 C)   Resp 14   SpO2 100%   Visual Acuity Right Eye Distance:   Left Eye Distance:   Bilateral Distance:    Right Eye Near:   Left Eye Near:    Bilateral Near:     Physical Exam Vitals and nursing note reviewed.  Constitutional:      General: She is in acute distress.     Appearance: Normal appearance. She is not ill-appearing.  Cardiovascular:     Rate and Rhythm: Normal rate and regular rhythm.     Pulses: Normal pulses.     Heart sounds: Normal heart sounds.  Pulmonary:     Effort: Pulmonary effort is normal.     Breath sounds: Normal breath sounds.  Abdominal:     General: Bowel sounds are normal.  Musculoskeletal:     Comments: Straight leg raise test is positive in the left leg.  Deep tendon reflex is 2+ bilaterally.  Skin:    General: Skin is warm.     Capillary Refill: Capillary refill takes less than 2 seconds.  Neurological:     General: No focal deficit present.     Mental Status: She is alert and oriented to person, place, and time.     Cranial Nerves: No cranial nerve deficit.     Sensory: No sensory deficit.      UC Treatments / Results  Labs (all labs ordered are listed, but only abnormal results are displayed) Labs Reviewed - No data to display  EKG   Radiology No results found.  Procedures Procedures (including critical care time)  Medications Ordered in UC Medications  ketorolac (TORADOL) 30 MG/ML injection 30 mg (30 mg Intramuscular Given 09/23/19 1557)    Initial Impression / Assessment and Plan / UC Course  I have reviewed the triage vital signs and the nursing notes.  Pertinent labs & imaging results that were available during my care  of the patient were reviewed by me and considered in my medical decision making (see chart for details).     1.  Sciatica left leg: Toradol 30  mg IM x1 Prednisone 40 mg orally daily for 5 days Tramadol 50 mg orally at bedtime as needed No indication for imaging at this time.  If patient symptoms worsens she is advised to return to urgent care to be reevaluated  2.  Diabetes mellitus type 2 with diabetic neuropathy: Continue to optimize blood sugar control If numbness is persistent, patient will benefit from gabapentin use. Final Clinical Impressions(s) / UC Diagnoses   Final diagnoses:  Sciatica, left side   Discharge Instructions   None    ED Prescriptions    Medication Sig Dispense Auth. Provider   predniSONE (DELTASONE) 20 MG tablet Take 2 tablets (40 mg total) by mouth daily for 5 days. 10 tablet Elizette Shek, Britta Mccreedy, MD   traMADol (ULTRAM) 50 MG tablet Take 1 tablet (50 mg total) by mouth at bedtime as needed for moderate pain or severe pain. 5 tablet Hertha Gergen, Britta Mccreedy, MD     I have reviewed the PDMP during this encounter.   Merrilee Jansky, MD 09/23/19 [id number]

## 2019-09-23 NOTE — ED Triage Notes (Addendum)
C/o left leg pain x3-4 days. Reports it is keeping her awake at night because it hurts so bad. Denies injury or trauma

## 2019-10-06 ENCOUNTER — Other Ambulatory Visit: Payer: Self-pay | Admitting: Internal Medicine

## 2019-10-06 DIAGNOSIS — Z1231 Encounter for screening mammogram for malignant neoplasm of breast: Secondary | ICD-10-CM

## 2019-10-17 IMAGING — CR DG TOE 2ND 2+V*R*
3 series · 3 of 3 positions shown · non-contrast
Comparison: None.

CLINICAL DATA: Right second toe injury today after dropping object
on foot with pain, swelling and bruising.

EXAM:
RIGHT SECOND TOE

[x toes ap right]
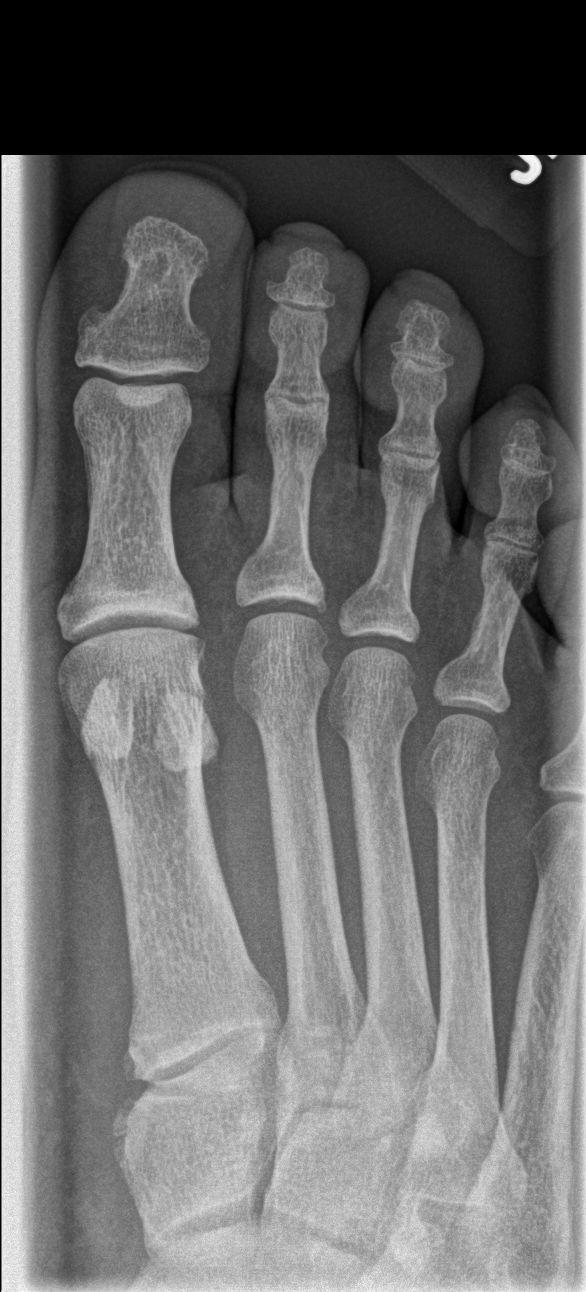

[x toes obl right]
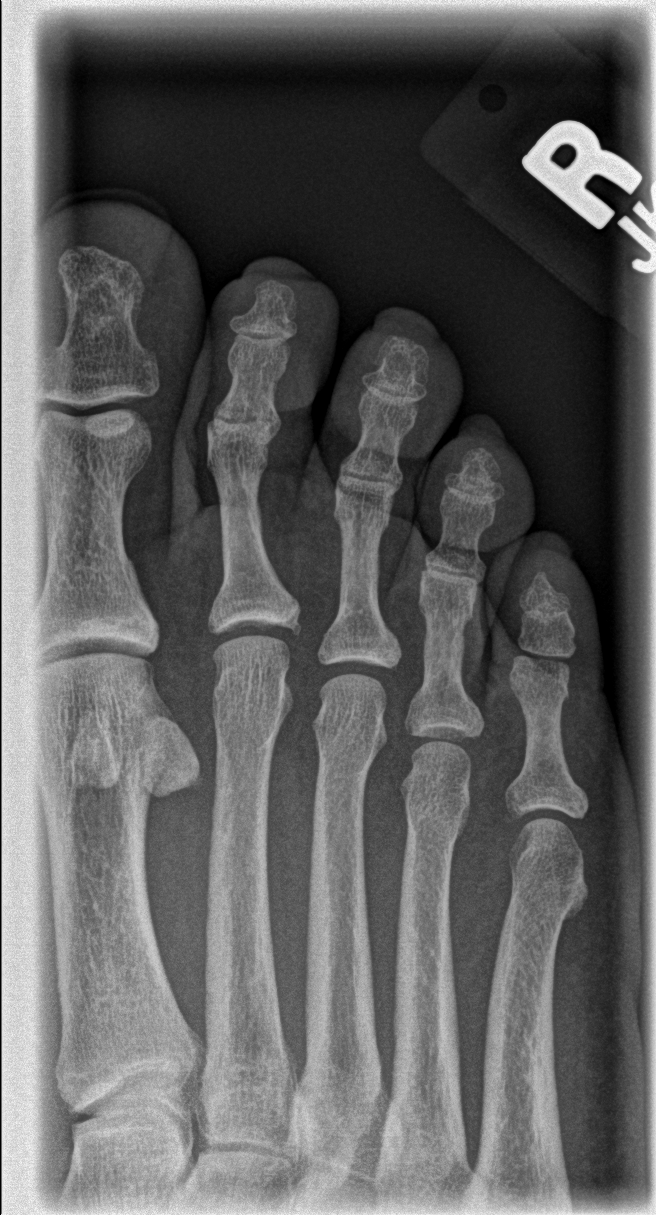

[x toes lat right]
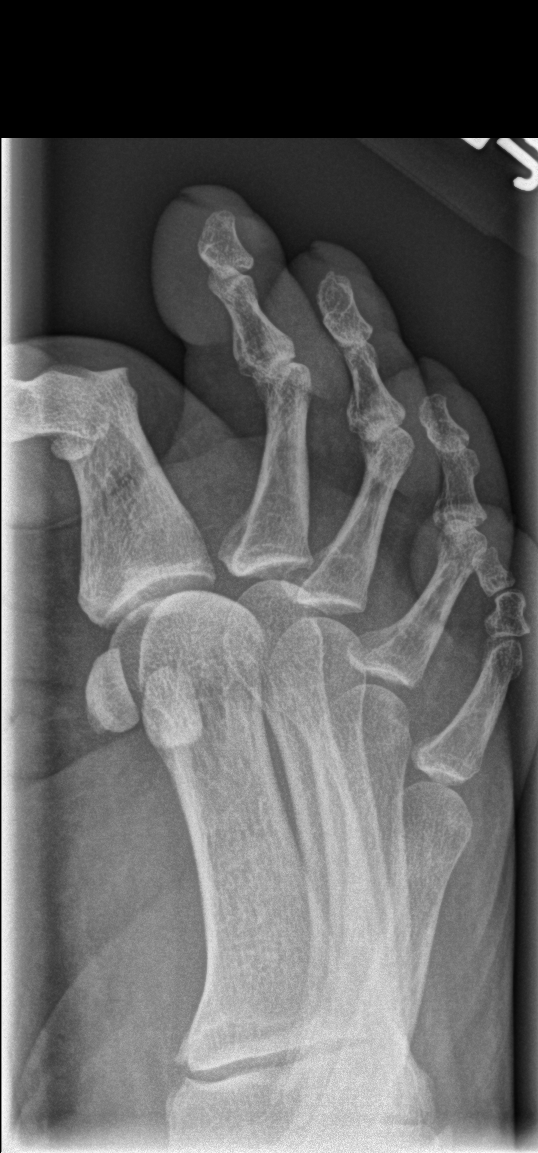

[3 of 3 positions shown; findings below may reference images not displayed]

FINDINGS: There is no evidence of fracture or dislocation. There is no
evidence of arthropathy or other focal bone abnormality. Soft
tissues are unremarkable.
IMPRESSION: Negative.

## 2019-11-13 ENCOUNTER — Ambulatory Visit
Admission: RE | Admit: 2019-11-13 | Discharge: 2019-11-13 | Disposition: A | Discharge status: Home / Self Care | Payer: BC Managed Care – PPO | Source: Ambulatory Visit | Attending: Internal Medicine | Admitting: Internal Medicine

## 2019-11-13 ENCOUNTER — Other Ambulatory Visit: Payer: Self-pay

## 2019-11-13 DIAGNOSIS — Z1231 Encounter for screening mammogram for malignant neoplasm of breast: Secondary | ICD-10-CM

## 2019-11-14 ENCOUNTER — Encounter (HOSPITAL_COMMUNITY): Payer: Self-pay

## 2019-11-14 ENCOUNTER — Ambulatory Visit (HOSPITAL_COMMUNITY)
Admission: EM | Admit: 2019-11-14 | Discharge: 2019-11-14 | Disposition: A | Discharge status: Home / Self Care | Payer: BC Managed Care – PPO | Attending: Family Medicine | Admitting: Family Medicine

## 2019-11-14 ENCOUNTER — Other Ambulatory Visit: Payer: Self-pay

## 2019-11-14 DIAGNOSIS — M5442 Lumbago with sciatica, left side: Secondary | ICD-10-CM | POA: Diagnosis not present

## 2019-11-14 MED ORDER — PREDNISONE 10 MG (21) PO TBPK: ORAL_TABLET | Freq: Every day | ORAL | 0 refills | Status: DC

## 2019-11-14 MED ORDER — KETOROLAC TROMETHAMINE 30 MG/ML IJ SOLN
INTRAMUSCULAR | Status: AC
  Filled 2019-11-14: qty 1

## 2019-11-14 MED ORDER — KETOROLAC TROMETHAMINE 30 MG/ML IJ SOLN
30.0000 mg | Freq: Once | INTRAMUSCULAR | Status: AC
  Administered 2019-11-14: 30 mg via INTRAMUSCULAR

## 2019-11-14 MED ORDER — TRAMADOL HCL 50 MG PO TABS: 50.0000 mg | ORAL_TABLET | Freq: Every evening | ORAL | 0 refills | Status: DC | PRN

## 2019-11-14 NOTE — Discharge Instructions (Addendum)
Meds ordered this encounter  Medications   ketorolac (TORADOL) 30 MG/ML injection 30 mg   traMADol (ULTRAM) 50 MG tablet    Sig: Take 1 tablet (50 mg total) by mouth at bedtime as needed for moderate pain or severe pain.    Dispense:  10 tablet    Refill:  0   predniSONE (STERAPRED UNI-PAK 21 TAB) 10 MG (21) TBPK tablet    Sig: Take by mouth daily. Take as directed.    Dispense:  21 tablet    Refill:  0

## 2019-11-14 NOTE — ED Provider Notes (Signed)
[facility]   [id number] 11/14/19 Arrival Time: 1008  ASSESSMENT & PLAN:  1. Acute left-sided low back pain with left-sided sciatica     Able to ambulate here and hemodynamically stable. No indication for imaging of back at this time given no trauma and normal neurological exam. Discussed.   Meds ordered this encounter  Medications   ketorolac (TORADOL) 30 MG/ML injection 30 mg   traMADol (ULTRAM) 50 MG tablet    Sig: Take 1 tablet (50 mg total) by mouth at bedtime as needed for moderate pain or severe pain.    Dispense:  10 tablet    Refill:  0   predniSONE (STERAPRED UNI-PAK 21 TAB) 10 MG (21) TBPK tablet    Sig: Take by mouth daily. Take as directed.    Dispense:  21 tablet    Refill:  0    Medication sedation precautions given. Encourage ROM/movement as tolerated.  Has scheduled f/u with PCP to discuss further management.   [place] Controlled Substances Registry consulted for this patient. I feel the risk/benefit ratio today is favorable for proceeding with this prescription for a controlled substance. Medication sedation precautions given.   Reviewed expectations re: course of current medical issues. Questions answered. Outlined signs and symptoms indicating need for more acute intervention. Patient verbalized understanding. After Visit Summary given.   SUBJECTIVE: History from: patient.  Jaime Daugherty is a 49 y.o. female who presents with complaint of intermittent lower left back pain. H/O similar in the past. Occasional radiation down left leg. Ambulatory without difficulty. No extremity sensation changes or weakness. Normal bowel/bladder habits. OTC analgesics without much relief. Pain is limiting daily activities.    OBJECTIVE:  Vitals:   11/14/19 1039  BP: (!) 141/62  Pulse: 100  Resp: 16  Temp: 99 F (37.2 C)  SpO2: 100%    General appearance: alert; no distress HEENT: [place]; AT Lungs: unlabored respirations; speaks full sentences  without difficulty Abdomen: soft, non-tender; non-distended Back: moderate and poorly localized tenderness to palpation over left paraspinal musculature toward buttock; FROM at waist; bruising: none; without midline tenderness Extremities: without edema; symmetrical without gross deformities; normal ROM of bilateral LE Skin: warm and dry Neurologic: normal gait; normal sensation and strength of bilateral LE Psychological: alert and cooperative; normal mood and affect    No Known Allergies  Past Medical History:  Diagnosis Date   Chest pain of uncertain etiology 08/22/2018   Diabetes mellitus without complication (HCC)    Hypertension    Social History   Socioeconomic History   Marital status: Single    Spouse name: Not on file   Number of children: 0   Years of education: Not on file   Highest education level: Not on file  Occupational History   Not on file  Tobacco Use   Smoking status: Former Smoker    Packs/day: 0.50    Types: Cigarettes    Quit date: 01/2018    Years since quitting: 1.7   Smokeless tobacco: Never Used  Vaping Use   Vaping Use: Never used  Substance and Sexual Activity   Alcohol use: Yes    Alcohol/week: 0.0 standard drinks    Comment: socially   Drug use: No   Sexual activity: Not on file  Other Topics Concern   Not on file  Social History Narrative   Not on file   Social Determinants of Health   Financial Resource Strain:    Difficulty of Paying Living Expenses:   Food Insecurity:  Worried About Programme researcher, broadcasting/film/video in the Last Year:    Barista in the Last Year:   Transportation Needs:    Freight forwarder (Medical):    Lack of Transportation (Non-Medical):   Physical Activity:    Days of Exercise per Week:    Minutes of Exercise per Session:   Stress:    Feeling of Stress :   Social Connections:    Frequency of Communication with Friends and Family:    Frequency of Social Gatherings with  Friends and Family:    Attends Religious Services:    Active Member of Clubs or Organizations:    Attends Engineer, structural:    Marital Status:   Intimate Partner Violence:    Fear of Current or Ex-Partner:    Emotionally Abused:    Physically Abused:    Sexually Abused:    Family History  Problem Relation Age of Onset   Hypertension Mother    Hypertension Sister    Diabetes Paternal Grandmother    Breast cancer Maternal Aunt    Past Surgical History:  Procedure Laterality Date   ABDOMINAL HYSTERECTOMY     Barrington Ellison, MD 11/14/19 1245

## 2019-11-14 NOTE — ED Triage Notes (Signed)
Patient complains of lower back pain x4-5 days. States the pain shoots down into her hip, buttock, and leg.

## 2020-03-08 ENCOUNTER — Ambulatory Visit (HOSPITAL_COMMUNITY)
Admission: EM | Admit: 2020-03-08 | Discharge: 2020-03-08 | Disposition: A | Discharge status: Home / Self Care | Payer: BC Managed Care – PPO | Attending: Internal Medicine | Admitting: Internal Medicine

## 2020-03-08 ENCOUNTER — Encounter (HOSPITAL_COMMUNITY): Payer: Self-pay | Admitting: Emergency Medicine

## 2020-03-08 ENCOUNTER — Other Ambulatory Visit: Payer: Self-pay

## 2020-03-08 DIAGNOSIS — R1013 Epigastric pain: Secondary | ICD-10-CM

## 2020-03-08 DIAGNOSIS — M25552 Pain in left hip: Secondary | ICD-10-CM

## 2020-03-08 LAB — POCT URINALYSIS DIPSTICK, ED / UC
Bilirubin Urine: NEGATIVE
Glucose, UA: NEGATIVE mg/dL
Hgb urine dipstick: NEGATIVE
Ketones, ur: NEGATIVE mg/dL
Nitrite: NEGATIVE
Protein, ur: NEGATIVE mg/dL
Specific Gravity, Urine: 1.015 (ref 1.005–1.030)
Urobilinogen, UA: 0.2 mg/dL (ref 0.0–1.0)
pH: 5 (ref 5.0–8.0)

## 2020-03-08 MED ORDER — OMEPRAZOLE 20 MG PO CPDR: 20.0000 mg | DELAYED_RELEASE_CAPSULE | Freq: Two times a day (BID) | ORAL | 0 refills | Status: DC

## 2020-03-08 MED ORDER — TRAMADOL HCL 50 MG PO TABS: 50.0000 mg | ORAL_TABLET | Freq: Four times a day (QID) | ORAL | 0 refills | Status: DC | PRN

## 2020-03-08 MED ORDER — PREDNISONE 10 MG (21) PO TBPK: ORAL_TABLET | ORAL | 0 refills | Status: DC

## 2020-03-08 NOTE — ED Triage Notes (Signed)
Patient reports left hip hurting since thanksgiving.  Patient reports being seen for this before.  Patient reports this pain radiates across back.    Abdominal pain since Saturday.  Patient has limited results for bowel movement.  Denies urinary symptoms.  Patient has waves of abdominal pain, nausea associated with these episodes

## 2020-03-08 NOTE — ED Provider Notes (Signed)
[facility]    CSN: [id number] Arrival date & time: 03/08/20  1447      History   Chief Complaint Chief Complaint  Patient presents with  . Abdominal Pain    HPI Jaime Daugherty is a 49 y.o. female.   Patient here c/w stomach pain x 3 days and L hip pain x several months, worse last few weeks.  PMH DM.  PSH hysterectomy, cholecystectomy.   Hip pain - she has been seen here previously for same on 6/21 and 8/21 for this.  She notes pain worsened on Thanksgiving, she has been taking between 1500 - 2000 mg naproxen every day or so for this since.  She notes pain does not radiate.  Denies n/t, weakness, RROM.    Stomach pain x 3 days, epigastric, does not radiate.  Worse with meals.  Denies weight loss, hematochezia, melena, n/v/d/c.       Past Medical History:  Diagnosis Date  . Chest pain of uncertain etiology 08/22/2018  . Diabetes mellitus without complication (HCC)   . Hypertension     Patient Active Problem List   Diagnosis Date Noted  . Uncontrolled type 2 diabetes mellitus with hyperglycemia (HCC) 08/23/2018  . Morbid obesity (HCC) 08/23/2018  . Chest pain of uncertain etiology 08/22/2018  . Diabetes (HCC) 08/26/2013  . Essential hypertension 06/25/2011    Past Surgical History:  Procedure Laterality Date  . ABDOMINAL HYSTERECTOMY    . CHOLECYSTECTOMY      OB History   No obstetric history on file.      Home Medications    Prior to Admission medications   Medication Sig Start Date End Date Taking? Authorizing Provider  furosemide (LASIX) 40 MG tablet Take 40 mg by mouth every other day. 11/02/17  Yes [provider]  Insulin Glargine (LANTUS SOLOSTAR) 100 UNIT/ML Solostar Pen Inject 30 Units into the skin daily. Patient taking differently: Inject 60 Units into the skin daily.  01/04/17  Yes Romero Belling, MD  lisinopril-hydrochlorothiazide (ZESTORETIC) 20-12.5 MG tablet Take 1 tablet by mouth daily.    Yes [provider]    Semaglutide (RYBELSUS) 14 MG TABS Take by mouth.   Yes [provider]  albuterol (PROVENTIL HFA;VENTOLIN HFA) 108 (90 Base) MCG/ACT inhaler Inhale 2 puffs into the lungs every 6 (six) hours as needed for wheezing or shortness of breath. 02/06/18   Elvina Sidle, MD  Liraglutide (VICTOZA [place]) Inject into the skin daily. 1.8 units    [provider]  omeprazole (PRILOSEC) 20 MG capsule Take 1 capsule (20 mg total) by mouth 2 (two) times daily before a meal. 03/08/20 04/07/20  Evern Core, PA-C  potassium chloride (K-DUR) 10 MEQ tablet Take 10 mEq by mouth daily.    [provider]  predniSONE (STERAPRED UNI-PAK 21 TAB) 10 MG (21) TBPK tablet Take by mouth daily. Take as directed. 11/14/19   Mardella Layman, MD  predniSONE (STERAPRED UNI-PAK 21 TAB) 10 MG (21) TBPK tablet Follow directions on packaging 03/08/20   Evern Core, PA-C  rosuvastatin (CRESTOR) 20 MG tablet Take 1 tablet (20 mg total) by mouth at bedtime. 08/22/18 08/22/19  Patwardhan, Anabel Bene, MD  traMADol (ULTRAM) 50 MG tablet Take 1 tablet (50 mg total) by mouth at bedtime as needed for moderate pain or severe pain. 11/14/19   Mardella Layman, MD  traMADol (ULTRAM) 50 MG tablet Take 1 tablet (50 mg total) by mouth every 6 (six) hours as needed. 03/08/20   Evern Core, PA-C  Family History Family History  Problem Relation Age of Onset  . Hypertension Mother   . Hypertension Sister   . Diabetes Paternal Grandmother   . Breast cancer Maternal Aunt     Social History Social History   Tobacco Use  . Smoking status: Former Smoker    Packs/day: 0.50    Types: Cigarettes    Quit date: 01/2018    Years since quitting: 2.1  . Smokeless tobacco: Never Used  Vaping Use  . Vaping Use: Never used  Substance Use Topics  . Alcohol use: Yes    Alcohol/week: 0.0 standard drinks    Comment: socially  . Drug use: No     Allergies   Patient has no known allergies.   Review of Systems Review of  Systems  Constitutional: Negative for chills, fatigue and fever.  HENT: Negative for congestion, ear pain, nosebleeds, postnasal drip, rhinorrhea, sinus pressure, sinus pain and sore throat.   Eyes: Negative for pain and redness.  Respiratory: Negative for cough, shortness of breath and wheezing.   Gastrointestinal: Positive for abdominal pain. Negative for abdominal distention, blood in stool, diarrhea, nausea and vomiting.  Musculoskeletal: Positive for arthralgias and myalgias. Negative for gait problem and joint swelling.  Skin: Negative for rash.  Neurological: Negative for light-headedness and headaches.  Hematological: Negative for adenopathy. Does not bruise/bleed easily.  Psychiatric/Behavioral: Negative for confusion and sleep disturbance.     Physical Exam Triage Vital Signs ED Triage Vitals  Enc Vitals Group     BP 03/08/20 1628 130/73     Pulse Rate 03/08/20 1628 (!) 104     Resp 03/08/20 1628 20     Temp 03/08/20 1628 98.1 F (36.7 C)     Temp Source 03/08/20 1628 Oral     SpO2 03/08/20 1628 98 %     Weight --      Height --      Head Circumference --      Peak Flow --      Pain Score 03/08/20 1625 8     Pain Loc --      Pain Edu? --      Excl. in GC? --    No data found.  Updated Vital Signs BP 130/73 (BP Location: Right Arm) Comment (BP Location): large cuff  Pulse (!) 104   Temp 98.1 F (36.7 C) (Oral)   Resp 20   SpO2 98%   Visual Acuity Right Eye Distance:   Left Eye Distance:   Bilateral Distance:    Right Eye Near:   Left Eye Near:    Bilateral Near:     Physical Exam Vitals and nursing note reviewed.  Constitutional:      General: She is not in acute distress.    Appearance: Normal appearance. She is not ill-appearing.  HENT:     Head: Normocephalic and atraumatic.     Right Ear: Tympanic membrane and ear canal normal.     Left Ear: Tympanic membrane and ear canal normal.     Nose: No congestion or rhinorrhea.     Mouth/Throat:      Pharynx: No oropharyngeal exudate or posterior oropharyngeal erythema.  Eyes:     General: No scleral icterus.    Extraocular Movements: Extraocular movements intact.     Conjunctiva/sclera: Conjunctivae normal.  Cardiovascular:     Rate and Rhythm: Normal rate and regular rhythm.     Heart sounds: No murmur heard.   Pulmonary:     Effort: Pulmonary  effort is normal. No respiratory distress.     Breath sounds: Normal breath sounds. No wheezing or rales.  Abdominal:     Tenderness: There is abdominal tenderness in the right lower quadrant, epigastric area, suprapubic area and left lower quadrant. There is no right CVA tenderness or left CVA tenderness. Negative signs include Murphy's sign, Rovsing's sign, McBurney's sign, psoas sign and obturator sign.     Hernia: No hernia is present.  Musculoskeletal:     Cervical back: Normal range of motion. No rigidity.     Lumbar back: Tenderness (L side lumbar) present. No spasms or bony tenderness. Normal range of motion. Negative right straight leg raise test and negative left straight leg raise test.     Left hip: Tenderness present. No deformity, lacerations, bony tenderness or crepitus. Normal range of motion. Normal strength.  Skin:    Capillary Refill: Capillary refill takes less than 2 seconds.     Coloration: Skin is not jaundiced.     Findings: No rash.  Neurological:     General: No focal deficit present.     Mental Status: She is alert and oriented to person, place, and time.     Motor: No weakness.     Gait: Gait normal.  Psychiatric:        Mood and Affect: Mood normal.        Behavior: Behavior normal.      UC Treatments / Results  Labs (all labs ordered are listed, but only abnormal results are displayed) Labs Reviewed  POCT URINALYSIS DIPSTICK, ED / UC - Abnormal; Notable for the following components:      Result Value   Leukocytes,Ua SMALL (*)    All other components within normal limits    EKG   Radiology No  results found.  Procedures Procedures (including critical care time)  Medications Ordered in UC Medications - No data to display  Initial Impression / Assessment and Plan / UC Course  I have reviewed the triage vital signs and the nursing notes.  Pertinent labs & imaging results that were available during my care of the patient were reviewed by me and considered in my medical decision making (see chart for details).     Suspect stomach pain due to excessive NSAID use, advise discontinue use of all NSAIDS Will start prednisone - advised her it will increase blood sugar, she is to monitor blood sugar closely. Follow up with PCP for stomach pain Follow up with ortho for chronic hip pain Final Clinical Impressions(s) / UC Diagnoses   Final diagnoses:  Abdominal pain, epigastric  Hip pain, acute, left     Discharge Instructions     Discontinue NSAID usage immediately Take medication as prescribed Follow up with PCP for stomach pain Follow up with ortho for hip pain.  Apply ice and heat to hip 4 times per day for 15 minutes each time Stretch hip daily      ED Prescriptions    Medication Sig Dispense Auth. Provider   traMADol (ULTRAM) 50 MG tablet Take 1 tablet (50 mg total) by mouth every 6 (six) hours as needed. 15 tablet Evern CoreLindquist, Johnney Scarlata, PA-C   predniSONE (STERAPRED UNI-PAK 21 TAB) 10 MG (21) TBPK tablet Follow directions on packaging 21 tablet Evern CoreLindquist, Elara Cocke, PA-C   omeprazole (PRILOSEC) 20 MG capsule Take 1 capsule (20 mg total) by mouth 2 (two) times daily before a meal. 60 capsule Evern CoreLindquist, Andre Swander, PA-C     PDMP not reviewed this encounter.  Evern Core, PA-C 03/08/20 1718

## 2020-03-08 NOTE — Discharge Instructions (Addendum)
Discontinue NSAID usage immediately Take medication as prescribed Follow up with PCP for stomach pain Follow up with ortho for hip pain.  Apply ice and heat to hip 4 times per day for 15 minutes each time Stretch hip daily

## 2020-06-13 ENCOUNTER — Other Ambulatory Visit: Payer: Self-pay | Admitting: Rehabilitation

## 2020-06-13 DIAGNOSIS — M4316 Spondylolisthesis, lumbar region: Secondary | ICD-10-CM

## 2020-06-14 ENCOUNTER — Encounter (HOSPITAL_COMMUNITY): Payer: Self-pay

## 2020-06-14 ENCOUNTER — Encounter (HOSPITAL_BASED_OUTPATIENT_CLINIC_OR_DEPARTMENT_OTHER): Payer: Self-pay | Admitting: Emergency Medicine

## 2020-06-14 ENCOUNTER — Other Ambulatory Visit: Payer: Self-pay

## 2020-06-14 ENCOUNTER — Ambulatory Visit (HOSPITAL_COMMUNITY)
Admission: EM | Admit: 2020-06-14 | Discharge: 2020-06-14 | Disposition: A | Discharge status: Home / Self Care | Payer: BC Managed Care – PPO

## 2020-06-14 ENCOUNTER — Emergency Department (HOSPITAL_BASED_OUTPATIENT_CLINIC_OR_DEPARTMENT_OTHER)
Admission: EM | Admit: 2020-06-14 | Discharge: 2020-06-14 | Disposition: A | Discharge status: Home / Self Care | Payer: BC Managed Care – PPO | Attending: Emergency Medicine | Admitting: Emergency Medicine

## 2020-06-14 ENCOUNTER — Emergency Department (HOSPITAL_BASED_OUTPATIENT_CLINIC_OR_DEPARTMENT_OTHER): Payer: BC Managed Care – PPO

## 2020-06-14 DIAGNOSIS — Z794 Long term (current) use of insulin: Secondary | ICD-10-CM | POA: Diagnosis not present

## 2020-06-14 DIAGNOSIS — I10 Essential (primary) hypertension: Secondary | ICD-10-CM

## 2020-06-14 DIAGNOSIS — E1169 Type 2 diabetes mellitus with other specified complication: Secondary | ICD-10-CM

## 2020-06-14 DIAGNOSIS — E1165 Type 2 diabetes mellitus with hyperglycemia: Secondary | ICD-10-CM | POA: Insufficient documentation

## 2020-06-14 DIAGNOSIS — Z87891 Personal history of nicotine dependence: Secondary | ICD-10-CM | POA: Insufficient documentation

## 2020-06-14 DIAGNOSIS — M79662 Pain in left lower leg: Secondary | ICD-10-CM

## 2020-06-14 DIAGNOSIS — Z79899 Other long term (current) drug therapy: Secondary | ICD-10-CM | POA: Insufficient documentation

## 2020-06-14 MED ORDER — IBUPROFEN 400 MG PO TABS
400.0000 mg | ORAL_TABLET | Freq: Once | ORAL | Status: AC
  Administered 2020-06-14: 400 mg via ORAL
  Filled 2020-06-14: qty 1

## 2020-06-14 NOTE — ED Notes (Signed)
US at Bedside

## 2020-06-14 NOTE — Discharge Instructions (Signed)
It was our pleasure to provide your ER care today - we hope that you feel better.  Take acetaminophen or ibuprofen as need.   Try gentle stretching of area.   Follow up with primary care doctor in 1-2 weeks if symptoms fail to improve/resolve.  Return to ER if worse, new symptoms, fevers, redness, severe swelling, or other concern.

## 2020-06-14 NOTE — ED Notes (Signed)
Patient is being discharged from the Urgent Care and sent to the Emergency Department via POV . Per Moshe Cipro, NP patient is in need of higher level of care due to potential DVT. Patient is aware and verbalizes understanding of plan of care.  Vitals:   06/14/20 1754  BP: 139/82  Pulse: 92  Resp: 18  Temp: 98.5 F (36.9 C)  SpO2: 100%

## 2020-06-14 NOTE — ED Triage Notes (Signed)
Pt c/o left calf pain X 3 days. Pt states she tried rubbing alcohol on her legs to relieve pain and states it did not work.

## 2020-06-14 NOTE — ED Provider Notes (Signed)
[name]    CSN: [id number] Arrival date & time: 06/14/20  1713      History   Chief Complaint Chief Complaint  Patient presents with  . Leg Pain    Left    HPI Jaime Daugherty is a 50 y.o. female.   Reports left calf pain that is worsened with walking. Reports the area as red, puffy and tender to touch. She has a medical history of hypertension, uncontrolled diabetes. Denies previous symptoms. Denies hx DVT, PE, other clotting history. Denies change in sensation, strength, other symptoms. Denies headache, cough, SOB, chest pain, fatigue, nausea, vomiting, diarrhea, rash, fever, other symptoms.   ROS per HPI  The history is provided by the patient.  Leg Pain   Past Medical History:  Diagnosis Date  . Chest pain of uncertain etiology 08/22/2018  . Diabetes mellitus without complication (HCC)   . Hypertension     Patient Active Problem List   Diagnosis Date Noted  . Uncontrolled type 2 diabetes mellitus with hyperglycemia (HCC) 08/23/2018  . Morbid obesity (HCC) 08/23/2018  . Chest pain of uncertain etiology 08/22/2018  . Diabetes (HCC) 08/26/2013  . Essential hypertension 06/25/2011    Past Surgical History:  Procedure Laterality Date  . ABDOMINAL HYSTERECTOMY    . CHOLECYSTECTOMY      OB History   No obstetric history on file.      Home Medications    Prior to Admission medications   Medication Sig Start Date End Date Taking? Authorizing Provider  albuterol (PROVENTIL HFA;VENTOLIN HFA) 108 (90 Base) MCG/ACT inhaler Inhale 2 puffs into the lungs every 6 (six) hours as needed for wheezing or shortness of breath. 02/06/18   Elvina Sidle, MD  furosemide (LASIX) 40 MG tablet Take 40 mg by mouth every other day. 11/02/17   [provider]  Insulin Glargine (LANTUS SOLOSTAR) 100 UNIT/ML Solostar Pen Inject 30 Units into the skin daily. Patient taking differently: Inject 60 Units into the skin daily.  01/04/17   Romero Belling, MD   Liraglutide (VICTOZA [place]) Inject into the skin daily. 1.8 units    [provider]  lisinopril-hydrochlorothiazide (ZESTORETIC) 20-12.5 MG tablet Take 1 tablet by mouth daily.     [provider]  omeprazole (PRILOSEC) 20 MG capsule Take 1 capsule (20 mg total) by mouth 2 (two) times daily before a meal. 03/08/20 04/07/20  Evern Core, PA-C  potassium chloride (K-DUR) 10 MEQ tablet Take 10 mEq by mouth daily.    [provider]  predniSONE (STERAPRED UNI-PAK 21 TAB) 10 MG (21) TBPK tablet Take by mouth daily. Take as directed. 11/14/19   Mardella Layman, MD  predniSONE (STERAPRED UNI-PAK 21 TAB) 10 MG (21) TBPK tablet Follow directions on packaging 03/08/20   Evern Core, PA-C  rosuvastatin (CRESTOR) 20 MG tablet Take 1 tablet (20 mg total) by mouth at bedtime. 08/22/18 08/22/19  Patwardhan, Anabel Bene, MD  Semaglutide (RYBELSUS) 14 MG TABS Take by mouth.    [provider]  traMADol (ULTRAM) 50 MG tablet Take 1 tablet (50 mg total) by mouth at bedtime as needed for moderate pain or severe pain. 11/14/19   Mardella Layman, MD  traMADol (ULTRAM) 50 MG tablet Take 1 tablet (50 mg total) by mouth every 6 (six) hours as needed. 03/08/20   Evern Core, PA-C    Family History Family History  Problem Relation Age of Onset  . Hypertension Mother   . Hypertension Sister   . Diabetes Paternal Grandmother   .  Breast cancer Maternal Aunt     Social History Social History   Tobacco Use  . Smoking status: Former Smoker    Packs/day: 0.50    Types: Cigarettes    Quit date: 12/2019    Years since quitting: 0.5  . Smokeless tobacco: Never Used  Vaping Use  . Vaping Use: Never used  Substance Use Topics  . Alcohol use: Yes    Alcohol/week: 0.0 standard drinks    Comment: socially  . Drug use: No     Allergies   Patient has no known allergies.   Review of Systems Review of Systems   Physical Exam Triage Vital Signs ED Triage Vitals  Enc Vitals Group      BP 06/14/20 1754 139/82     Pulse Rate 06/14/20 1754 92     Resp 06/14/20 1754 18     Temp 06/14/20 1754 98.5 F (36.9 C)     Temp Source 06/14/20 1754 Oral     SpO2 06/14/20 1754 100 %     Weight --      Height --      Head Circumference --      Peak Flow --      Pain Score 06/14/20 1753 10     Pain Loc --      Pain Edu? --      Excl. in GC? --    No data found.  Updated Vital Signs BP 139/82 (BP Location: Right Arm)   Pulse 92   Temp 98.5 F (36.9 C) (Oral)   Resp 18   SpO2 100%       Physical Exam Vitals and nursing note reviewed.  Constitutional:      General: She is not in acute distress.    Appearance: Normal appearance. She is well-developed. She is not ill-appearing.  HENT:     Head: Normocephalic and atraumatic.  Eyes:     Extraocular Movements: Extraocular movements intact.     Conjunctiva/sclera: Conjunctivae normal.     Pupils: Pupils are equal, round, and reactive to light.  Cardiovascular:     Rate and Rhythm: Normal rate and regular rhythm.     Heart sounds: Normal heart sounds. No murmur heard.   Pulmonary:     Effort: Pulmonary effort is normal. No respiratory distress.     Breath sounds: Normal breath sounds.  Abdominal:     Palpations: Abdomen is soft.     Tenderness: There is no abdominal tenderness.  Musculoskeletal:        General: Normal range of motion.     Cervical back: Normal range of motion and neck supple.     Left lower leg: Swelling present.       Legs:  Skin:    General: Skin is warm and dry.     Capillary Refill: Capillary refill takes less than 2 seconds.  Neurological:     General: No focal deficit present.     Mental Status: She is alert and oriented to person, place, and time.     Cranial Nerves: No cranial nerve deficit.     Sensory: No sensory deficit.     Motor: No weakness.     Coordination: Coordination normal.     Gait: Gait normal.     Deep Tendon Reflexes: Reflexes normal.  Psychiatric:        Mood  and Affect: Mood normal.        Behavior: Behavior normal.        Thought  Content: Thought content normal.      UC Treatments / Results  Labs (all labs ordered are listed, but only abnormal results are displayed) Labs Reviewed - No data to display  EKG   Radiology US Venous Img Lower  Left (DVT Study)  Result Date: 06/14/2020 CLINICAL DATA:  Left calf pain. EXAM: LEFT LOWER EXTREMITY VENOUS DOPPLER ULTRASOUND TECHNIQUE: Gray-scale sonography with compression, as well as color and duplex ultrasound, were performed to evaluate the deep venous system(s) from the level of the common femoral vein through the popliteal and proximal calf veins. COMPARISON:  None. FINDINGS: VENOUS Normal compressibility of the LEFT common femoral, superficial femoral, and popliteal veins, as well as the visualized LEFT calf veins. Visualized portions of profunda femoral vein and great saphenous vein unremarkable. No filling defects to suggest DVT on grayscale or color Doppler imaging. Doppler waveforms show normal direction of venous flow, normal respiratory plasticity and response to augmentation. Limited views of the contralateral common femoral vein are unremarkable. OTHER None. Limitations: none IMPRESSION: Negative. Electronically Signed   By: Aram Candela M.D.   On: 06/14/2020 20:09    Procedures Procedures (including critical care time)  Medications Ordered in UC Medications - No data to display  Initial Impression / Assessment and Plan / UC Course  I have reviewed the triage vital signs and the nursing notes.  Pertinent labs & imaging results that were available during my care of the patient were reviewed by me and considered in my medical decision making (see chart for details).    Pain of Left Calf HTN Type 2 Diabetes Morbid Obesity  Discussed with patient that given her symptoms, risk factors, and location of her pain and tenderness that she would be best served in the ER Discussed that  we could not rule out a DVT in this office Patient verbalized understanding and is in agreement with treatment plan Patient to ER via POV  Final Clinical Impressions(s) / UC Diagnoses   Final diagnoses:  Pain of left calf  Essential hypertension  Type 2 diabetes mellitus with other specified complication, unspecified whether long term insulin use (HCC)  Morbid obesity (HCC)     Discharge Instructions     Feel that you need to be evaluated for potential blood clot    ED Prescriptions    None     PDMP not reviewed this encounter.   Moshe Cipro, NP 06/16/20 [phone]

## 2020-06-14 NOTE — ED Triage Notes (Signed)
Pt via pov from UC with left calf pain x 3 days, intensifying today. Pt states it is worse when she is walking, feels better when she is off her foot. Denies any injury or trauma, sob, n/v. Pt alert & oriented, nad noted.

## 2020-06-14 NOTE — ED Notes (Signed)
Patient is limping.  Patient denies injury.  Patient reports pain for 2 days, but worsening.  Grossly, bilateral calves look equal in size.  But there is an area of fullness to left mid calf that patient reports is painful.  No color variation or temperature variation to touch

## 2020-06-14 NOTE — ED Provider Notes (Signed)
[facility] EMERGENCY DEPT Provider Note   CSN: [id number] Arrival date & time: 06/14/20  1844     History Chief Complaint  Patient presents with  . Leg Pain    Jaime Daugherty is a 50 y.o. female.  Patient c/o left calf pain for the past few days. Symptoms acute onset, moderate, constant, dull, persistent, non radiating. No hx same. Denies calf injury or strain. No radicular pain. No numbness/weakness. Denies chest pain or sob. No hx dvt or pe. No recent surgery, immobility, trauma or travel. No new exercise/activity. No skin changes/redness/lesions. Was seen at urgent care and sent to ED for evaluation.   The history is provided by the patient.  Leg Pain Associated symptoms: no back pain and no fever        Past Medical History:  Diagnosis Date  . Chest pain of uncertain etiology 08/22/2018  . Diabetes mellitus without complication (HCC)   . Hypertension     Patient Active Problem List   Diagnosis Date Noted  . Uncontrolled type 2 diabetes mellitus with hyperglycemia (HCC) 08/23/2018  . Morbid obesity (HCC) 08/23/2018  . Chest pain of uncertain etiology 08/22/2018  . Diabetes (HCC) 08/26/2013  . Essential hypertension 06/25/2011    Past Surgical History:  Procedure Laterality Date  . ABDOMINAL HYSTERECTOMY    . CHOLECYSTECTOMY       OB History   No obstetric history on file.     Family History  Problem Relation Age of Onset  . Hypertension Mother   . Hypertension Sister   . Diabetes Paternal Grandmother   . Breast cancer Maternal Aunt     Social History   Tobacco Use  . Smoking status: Former Smoker    Packs/day: 0.50    Types: Cigarettes    Quit date: 12/2019    Years since quitting: 0.5  . Smokeless tobacco: Never Used  Vaping Use  . Vaping Use: Never used  Substance Use Topics  . Alcohol use: Yes    Alcohol/week: 0.0 standard drinks    Comment: socially  . Drug use: No    Home Medications Prior to Admission medications    Medication Sig Start Date End Date Taking? Authorizing Provider  albuterol (PROVENTIL HFA;VENTOLIN HFA) 108 (90 Base) MCG/ACT inhaler Inhale 2 puffs into the lungs every 6 (six) hours as needed for wheezing or shortness of breath. 02/06/18   Elvina Sidle, MD  furosemide (LASIX) 40 MG tablet Take 40 mg by mouth every other day. 11/02/17   [provider]  Insulin Glargine (LANTUS SOLOSTAR) 100 UNIT/ML Solostar Pen Inject 30 Units into the skin daily. Patient taking differently: Inject 60 Units into the skin daily.  01/04/17   Romero Belling, MD  Liraglutide (VICTOZA [place]) Inject into the skin daily. 1.8 units    [provider]  lisinopril-hydrochlorothiazide (ZESTORETIC) 20-12.5 MG tablet Take 1 tablet by mouth daily.     [provider]  omeprazole (PRILOSEC) 20 MG capsule Take 1 capsule (20 mg total) by mouth 2 (two) times daily before a meal. 03/08/20 04/07/20  Evern Core, PA-C  potassium chloride (K-DUR) 10 MEQ tablet Take 10 mEq by mouth daily.    [provider]  predniSONE (STERAPRED UNI-PAK 21 TAB) 10 MG (21) TBPK tablet Take by mouth daily. Take as directed. 11/14/19   Mardella Layman, MD  predniSONE (STERAPRED UNI-PAK 21 TAB) 10 MG (21) TBPK tablet Follow directions on packaging 03/08/20   Evern Core, PA-C  rosuvastatin (CRESTOR) 20 MG tablet Take  1 tablet (20 mg total) by mouth at bedtime. 08/22/18 08/22/19  Patwardhan, Anabel Bene, MD  Semaglutide (RYBELSUS) 14 MG TABS Take by mouth.    [provider]  traMADol (ULTRAM) 50 MG tablet Take 1 tablet (50 mg total) by mouth at bedtime as needed for moderate pain or severe pain. 11/14/19   Mardella Layman, MD  traMADol (ULTRAM) 50 MG tablet Take 1 tablet (50 mg total) by mouth every 6 (six) hours as needed. 03/08/20   Evern Core, PA-C    Allergies    Patient has no known allergies.  Review of Systems   Review of Systems  Constitutional: Negative for chills and fever.  HENT: Negative for sore  throat.   Eyes: Negative for redness.  Respiratory: Negative for shortness of breath.   Cardiovascular: Negative for chest pain.  Gastrointestinal: Negative for abdominal pain.  Genitourinary: Negative for flank pain.  Musculoskeletal: Negative for back pain.  Skin: Negative for rash.  Neurological: Negative for weakness and numbness.  Hematological: Does not bruise/bleed easily.  Psychiatric/Behavioral: Negative for confusion.    Physical Exam Updated Vital Signs BP (!) 150/77 (BP Location: Right Arm)   Pulse (!) 102   Temp 98.2 F (36.8 C) (Oral)   Resp 18   Ht 1.651 m (5\' 5" )   Wt 120.2 kg   SpO2 100%   BMI 44.10 kg/m   Physical Exam Vitals and nursing note reviewed.  Constitutional:      Appearance: Normal appearance. She is well-developed.  HENT:     Head: Atraumatic.     Nose: Nose normal.     Mouth/Throat:     Mouth: Mucous membranes are moist.  Eyes:     General: No scleral icterus.    Conjunctiva/sclera: Conjunctivae normal.  Neck:     Trachea: No tracheal deviation.  Cardiovascular:     Rate and Rhythm: Normal rate and regular rhythm.     Pulses: Normal pulses.     Heart sounds: Normal heart sounds. No murmur heard. No friction rub. No gallop.   Pulmonary:     Effort: Pulmonary effort is normal. No respiratory distress.     Breath sounds: Normal breath sounds.  Abdominal:     General: There is no distension.  Genitourinary:    Comments: No cva tenderness.  Musculoskeletal:        General: No swelling.     Cervical back: Neck supple. No muscular tenderness.     Comments: Mild tenderness left calf. No gross sts noted. Compartments of leg soft, not tense. Distal pulses palp.   Skin:    General: Skin is warm and dry.     Findings: No rash.     Comments: No skin lesions or cellulitis noted.   Neurological:     Mental Status: She is alert.     Comments: Alert, speech normal. Left foot motor/sens grossly intact. Steady gait.   Psychiatric:         Mood and Affect: Mood normal.     ED Results / Procedures / Treatments   Labs (all labs ordered are listed, but only abnormal results are displayed) Labs Reviewed - No data to display  EKG None  Radiology Venous Img Lower  Left (DVT Study)  Result Date: 06/14/2020 CLINICAL DATA:  Left calf pain. EXAM: LEFT LOWER EXTREMITY VENOUS DOPPLER ULTRASOUND TECHNIQUE: Gray-scale sonography with compression, as well as color and duplex ultrasound, were performed to evaluate the deep venous system(s) from the level of the common  femoral vein through the popliteal and proximal calf veins. COMPARISON:  None. FINDINGS: VENOUS Normal compressibility of the LEFT common femoral, superficial femoral, and popliteal veins, as well as the visualized LEFT calf veins. Visualized portions of profunda femoral vein and great saphenous vein unremarkable. No filling defects to suggest DVT on grayscale or color Doppler imaging. Doppler waveforms show normal direction of venous flow, normal respiratory plasticity and response to augmentation. Limited views of the contralateral common femoral vein are unremarkable. OTHER None. Limitations: none IMPRESSION: Negative. Electronically Signed   By: Aram Candela M.D.   On: 06/14/2020 20:09    Procedures Procedures   Medications Ordered in ED Medications - No data to display  ED Course  I have reviewed the triage vital signs and the nursing notes.  Pertinent labs & imaging results that were available during my care of the patient were reviewed by me and considered in my medical decision making (see chart for details).    MDM Rules/Calculators/A&P                         Reviewed nursing notes and prior charts for additional history. Pt sent from urgent care for u/s study.   Imaging ordered.  U/s reviewed/interpreted by me - no dvt.   Ibuprofen po.  Discussed u/s w pt.   Pt appears stable for d/c.      Final Clinical Impression(s) / ED  Diagnoses Final diagnoses:  None    Rx / DC Orders ED Discharge Orders    None       Cathren Laine, MD 06/14/20 2028

## 2020-06-14 NOTE — Discharge Instructions (Signed)
Feel that you need to be evaluated for potential blood clot

## 2020-07-01 ENCOUNTER — Other Ambulatory Visit: Payer: BC Managed Care – PPO

## 2020-08-08 ENCOUNTER — Encounter (HOSPITAL_COMMUNITY): Payer: Self-pay | Admitting: *Deleted

## 2020-08-08 ENCOUNTER — Other Ambulatory Visit: Payer: Self-pay

## 2020-08-08 ENCOUNTER — Ambulatory Visit (HOSPITAL_COMMUNITY)
Admission: EM | Admit: 2020-08-08 | Discharge: 2020-08-08 | Disposition: A | Discharge status: Home / Self Care | Payer: BC Managed Care – PPO | Attending: Emergency Medicine | Admitting: Emergency Medicine

## 2020-08-08 DIAGNOSIS — J069 Acute upper respiratory infection, unspecified: Secondary | ICD-10-CM | POA: Diagnosis not present

## 2020-08-08 DIAGNOSIS — Z79899 Other long term (current) drug therapy: Secondary | ICD-10-CM | POA: Insufficient documentation

## 2020-08-08 DIAGNOSIS — R059 Cough, unspecified: Secondary | ICD-10-CM | POA: Diagnosis not present

## 2020-08-08 DIAGNOSIS — Z794 Long term (current) use of insulin: Secondary | ICD-10-CM | POA: Diagnosis not present

## 2020-08-08 DIAGNOSIS — B349 Viral infection, unspecified: Secondary | ICD-10-CM | POA: Diagnosis not present

## 2020-08-08 DIAGNOSIS — J029 Acute pharyngitis, unspecified: Secondary | ICD-10-CM | POA: Diagnosis present

## 2020-08-08 DIAGNOSIS — Z7952 Long term (current) use of systemic steroids: Secondary | ICD-10-CM | POA: Insufficient documentation

## 2020-08-08 DIAGNOSIS — Z7984 Long term (current) use of oral hypoglycemic drugs: Secondary | ICD-10-CM | POA: Insufficient documentation

## 2020-08-08 DIAGNOSIS — Z20822 Contact with and (suspected) exposure to covid-19: Secondary | ICD-10-CM | POA: Diagnosis not present

## 2020-08-08 NOTE — ED Triage Notes (Signed)
Pt reports Sore throat ,HA that started today . Pt was informed she was with some one at work that tested Positive for COVID.

## 2020-08-08 NOTE — ED Provider Notes (Signed)
[facility]    CSN: [id number] Arrival date & time: 08/08/20  1659      History   Chief Complaint Chief Complaint  Patient presents with  . Sore Throat  . Cough  . Headache    HPI Jaime Daugherty is a 50 y.o. female.   Patient here for evaluation of headache, sore throat and cough that started today.  Denies any fevers.  Reports coworker tested positive for COVID on Friday.  Has not taken any OTC medications or treatments.  Denies any trauma, injury, or other precipitating event.  Denies any specific alleviating or aggravating factors.  Denies any fevers, chest pain, shortness of breath, N/V/D, numbness, tingling, weakness, abdominal pain, or headaches.     The history is provided by the patient.  Sore Throat Associated symptoms include headaches.  Cough Associated symptoms: headaches   Headache Associated symptoms: cough     Past Medical History:  Diagnosis Date  . Chest pain of uncertain etiology 08/22/2018  . Diabetes mellitus without complication (HCC)   . Hypertension     Patient Active Problem List   Diagnosis Date Noted  . Uncontrolled type 2 diabetes mellitus with hyperglycemia (HCC) 08/23/2018  . Morbid obesity (HCC) 08/23/2018  . Chest pain of uncertain etiology 08/22/2018  . Diabetes (HCC) 08/26/2013  . Essential hypertension 06/25/2011    Past Surgical History:  Procedure Laterality Date  . ABDOMINAL HYSTERECTOMY    . CHOLECYSTECTOMY      OB History   No obstetric history on file.      Home Medications    Prior to Admission medications   Medication Sig Start Date End Date Taking? Authorizing Provider  albuterol (PROVENTIL HFA;VENTOLIN HFA) 108 (90 Base) MCG/ACT inhaler Inhale 2 puffs into the lungs every 6 (six) hours as needed for wheezing or shortness of breath. 02/06/18   Elvina Sidle, MD  furosemide (LASIX) 40 MG tablet Take 40 mg by mouth every other day. 11/02/17   [provider]  Insulin Glargine (LANTUS  SOLOSTAR) 100 UNIT/ML Solostar Pen Inject 30 Units into the skin daily. Patient taking differently: Inject 60 Units into the skin daily.  01/04/17   Romero Belling, MD  Liraglutide (VICTOZA [place]) Inject into the skin daily. 1.8 units    [provider]  lisinopril-hydrochlorothiazide (ZESTORETIC) 20-12.5 MG tablet Take 1 tablet by mouth daily.     [provider]  omeprazole (PRILOSEC) 20 MG capsule Take 1 capsule (20 mg total) by mouth 2 (two) times daily before a meal. 03/08/20 04/07/20  Evern Core, PA-C  potassium chloride (K-DUR) 10 MEQ tablet Take 10 mEq by mouth daily.    [provider]  predniSONE (STERAPRED UNI-PAK 21 TAB) 10 MG (21) TBPK tablet Take by mouth daily. Take as directed. 11/14/19   Mardella Layman, MD  predniSONE (STERAPRED UNI-PAK 21 TAB) 10 MG (21) TBPK tablet Follow directions on packaging 03/08/20   Evern Core, PA-C  rosuvastatin (CRESTOR) 20 MG tablet Take 1 tablet (20 mg total) by mouth at bedtime. 08/22/18 08/22/19  Patwardhan, Anabel Bene, MD  Semaglutide (RYBELSUS) 14 MG TABS Take by mouth.    [provider]  traMADol (ULTRAM) 50 MG tablet Take 1 tablet (50 mg total) by mouth at bedtime as needed for moderate pain or severe pain. 11/14/19   Mardella Layman, MD  traMADol (ULTRAM) 50 MG tablet Take 1 tablet (50 mg total) by mouth every 6 (six) hours as needed. 03/08/20   Evern Core, PA-C  Family History Family History  Problem Relation Age of Onset  . Hypertension Mother   . Hypertension Sister   . Diabetes Paternal Grandmother   . Breast cancer Maternal Aunt     Social History Social History   Tobacco Use  . Smoking status: Former Smoker    Packs/day: 0.50    Types: Cigarettes    Quit date: 12/2019    Years since quitting: 0.6  . Smokeless tobacco: Never Used  Vaping Use  . Vaping Use: Never used  Substance Use Topics  . Alcohol use: Yes    Alcohol/week: 0.0 standard drinks    Comment: socially  . Drug use: No      Allergies   Patient has no known allergies.   Review of Systems Review of Systems  Respiratory: Positive for cough.   Neurological: Positive for headaches.  All other systems reviewed and are negative.    Physical Exam Triage Vital Signs ED Triage Vitals  Enc Vitals Group     BP 08/08/20 1747 132/88     Pulse Rate 08/08/20 1747 (!) 102     Resp 08/08/20 1747 18     Temp 08/08/20 1747 98.2 F (36.8 C)     Temp Source 08/08/20 1747 Oral     SpO2 08/08/20 1747 100 %     Weight --      Height --      Head Circumference --      Peak Flow --      Pain Score 08/08/20 1743 4     Pain Loc --      Pain Edu? --      Excl. in GC? --    No data found.  Updated Vital Signs BP 132/88 (BP Location: Right Arm)   Pulse (!) 102   Temp 98.2 F (36.8 C) (Oral)   Resp 18   SpO2 100%   Visual Acuity Right Eye Distance:   Left Eye Distance:   Bilateral Distance:    Right Eye Near:   Left Eye Near:    Bilateral Near:     Physical Exam Vitals and nursing note reviewed.  Constitutional:      General: She is not in acute distress.    Appearance: Normal appearance. She is not ill-appearing, toxic-appearing or diaphoretic.  HENT:     Head: Normocephalic and atraumatic.     Right Ear: Tympanic membrane and ear canal normal.     Left Ear: Tympanic membrane and ear canal normal.     Mouth/Throat:     Mouth: No oral lesions.     Pharynx: Uvula midline. No pharyngeal swelling, oropharyngeal exudate, posterior oropharyngeal erythema or uvula swelling.     Tonsils: No tonsillar exudate or tonsillar abscesses. 0 on the right. 0 on the left.  Eyes:     Conjunctiva/sclera: Conjunctivae normal.  Cardiovascular:     Rate and Rhythm: Normal rate.     Pulses: Normal pulses.     Heart sounds: Normal heart sounds.  Pulmonary:     Effort: Pulmonary effort is normal.     Breath sounds: Normal breath sounds.  Abdominal:     General: Abdomen is flat.     Palpations: Abdomen is soft.   Musculoskeletal:        General: Normal range of motion.     Cervical back: Normal range of motion.  Skin:    General: Skin is warm and dry.  Neurological:     General: No focal deficit present.  Mental Status: She is alert and oriented to person, place, and time.  Psychiatric:        Mood and Affect: Mood normal.      UC Treatments / Results  Labs (all labs ordered are listed, but only abnormal results are displayed) Labs Reviewed  SARS CORONAVIRUS 2 (TAT 6-24 HRS)    EKG   Radiology No results found.  Procedures Procedures (including critical care time)  Medications Ordered in UC Medications - No data to display  Initial Impression / Assessment and Plan / UC Course  I have reviewed the triage vital signs and the nursing notes.  Pertinent labs & imaging results that were available during my care of the patient were reviewed by me and considered in my medical decision making (see chart for details).    Assessment negative for red flags or concerns.  This is likely a viral illness.  COVID test pending.  Work note supplied.  Discussed conservative symptom management as described below in discharge instructions.  Follow-up as needed. Final Clinical Impressions(s) / UC Diagnoses   Final diagnoses:  Viral illness  Viral upper respiratory tract infection     Discharge Instructions     You most likely have viral illness.   You can take Tylenol and/or Ibuprofen as needed for fever reduction and pain relief.   You can use Flonase 1-2 sprays in each nostril daily.   For cough: honey 1/2 to 1 teaspoon (you can dilute the honey in water or another fluid).  You can use a humidifier for chest congestion and cough.  If you don't have a humidifier, you can sit in the bathroom with the hot shower running.    For sore throat: try warm salt water gargles, cepacol lozenges, throat spray, warm tea or water with lemon/honey, popsicles or ice, or OTC cold relief medicine for  throat discomfort.    For congestion: take a daily anti-histamine like Zyrtec, Claritin, and a oral decongestant to help with post nasal drip that may be irritating your throat.    It is important to stay hydrated: drink plenty of fluids (water, gatorade/powerade/pedialyte, juices, or teas) to keep your throat moisturized and help further relieve irritation/discomfort.   Return or go to the Emergency Department if symptoms worsen or do not improve in the next few days.      ED Prescriptions    None     PDMP not reviewed this encounter.   Ivette Loyal, NP 08/08/20 1827

## 2020-08-08 NOTE — Discharge Instructions (Signed)
You most likely have viral illness.   You can take Tylenol and/or Ibuprofen as needed for fever reduction and pain relief.   You can use Flonase 1-2 sprays in each nostril daily.   For cough: honey 1/2 to 1 teaspoon (you can dilute the honey in water or another fluid).  You can use a humidifier for chest congestion and cough.  If you don't have a humidifier, you can sit in the bathroom with the hot shower running.    For sore throat: try warm salt water gargles, cepacol lozenges, throat spray, warm tea or water with lemon/honey, popsicles or ice, or OTC cold relief medicine for throat discomfort.    For congestion: take a daily anti-histamine like Zyrtec, Claritin, and a oral decongestant to help with post nasal drip that may be irritating your throat.    It is important to stay hydrated: drink plenty of fluids (water, gatorade/powerade/pedialyte, juices, or teas) to keep your throat moisturized and help further relieve irritation/discomfort.   Return or go to the Emergency Department if symptoms worsen or do not improve in the next few days.

## 2020-08-09 LAB — SARS CORONAVIRUS 2 (TAT 6-24 HRS): SARS Coronavirus 2: NEGATIVE

## 2020-11-09 ENCOUNTER — Encounter (HOSPITAL_COMMUNITY): Payer: Self-pay | Admitting: *Deleted

## 2020-11-09 ENCOUNTER — Other Ambulatory Visit: Payer: Self-pay

## 2020-11-09 ENCOUNTER — Ambulatory Visit (HOSPITAL_COMMUNITY)
Admission: EM | Admit: 2020-11-09 | Discharge: 2020-11-09 | Disposition: A | Discharge status: Home / Self Care | Payer: BC Managed Care – PPO

## 2020-11-09 DIAGNOSIS — M5441 Lumbago with sciatica, right side: Secondary | ICD-10-CM | POA: Diagnosis not present

## 2020-11-09 DIAGNOSIS — M5442 Lumbago with sciatica, left side: Secondary | ICD-10-CM | POA: Diagnosis not present

## 2020-11-09 MED ORDER — KETOROLAC TROMETHAMINE 30 MG/ML IJ SOLN
30.0000 mg | Freq: Once | INTRAMUSCULAR | Status: AC
  Administered 2020-11-09: 30 mg via INTRAMUSCULAR

## 2020-11-09 MED ORDER — METHOCARBAMOL 500 MG PO TABS: 500.0000 mg | ORAL_TABLET | Freq: Two times a day (BID) | ORAL | 0 refills | Status: DC

## 2020-11-09 NOTE — ED Triage Notes (Signed)
Pt reports ongoing intermittent low back pains - states is supposed to have MRI, but waiting to decrease deductible. States started with flare-up yesterday with worsening back pain today.  Pain radiates usually down into LLE, but occasionally to RLE.  Denies any parasthesias.

## 2020-11-09 NOTE — ED Provider Notes (Signed)
[facility]    CSN: [id number] Arrival date & time: 11/09/20  2778      History   Chief Complaint Chief Complaint  Patient presents with   Back Pain    HPI Jaime Daugherty is a 50 y.o. female.   HPI  Back Pain: Patient has chronic low back pain.  She is seeing a specialist and is waiting on an MRI scan.  She states that she had a flareup yesterday with unknown trigger.  She states that the symptoms have worsened as of today.  Has associated radiation of pain down to the left lower extremity and occasional right lower extremity.  She denies any numbness, weakness, loss of bowel or bladder function. She has tried nothing yet for symptoms as typically Toradol injection and muscle relaxer's work well. * of note she did not take her BP meds this morning but plans to go right home to take them.   Past Medical History:  Diagnosis Date   Chest pain of uncertain etiology 08/22/2018   Diabetes mellitus without complication [facility])    Hypertension     Patient Active Problem List   Diagnosis Date Noted   Uncontrolled type 2 diabetes mellitus with hyperglycemia (HCC) 08/23/2018   Morbid obesity (HCC) 08/23/2018   Chest pain of uncertain etiology 08/22/2018   Diabetes (HCC) 08/26/2013   Essential hypertension 06/25/2011    Past Surgical History:  Procedure Laterality Date   ABDOMINAL HYSTERECTOMY     CHOLECYSTECTOMY      OB History   No obstetric history on file.      Home Medications    Prior to Admission medications   Medication Sig Start Date End Date Taking? Authorizing Provider  Dulaglutide (TRULICITY [place]) Inject into the skin.   Yes [provider]  furosemide (LASIX) 40 MG tablet Take 40 mg by mouth every other day. 11/02/17  Yes [provider]  Insulin Glargine (LANTUS SOLOSTAR) 100 UNIT/ML Solostar Pen Inject 30 Units into the skin daily. Patient taking differently: Inject 60 Units into the skin daily. 01/04/17  Yes Romero Belling, MD   lisinopril-hydrochlorothiazide (ZESTORETIC) 20-12.5 MG tablet Take 1 tablet by mouth daily.    Yes [provider]  albuterol (PROVENTIL HFA;VENTOLIN HFA) 108 (90 Base) MCG/ACT inhaler Inhale 2 puffs into the lungs every 6 (six) hours as needed for wheezing or shortness of breath. 02/06/18   Elvina Sidle, MD  Liraglutide (VICTOZA ) Inject into the skin daily. 1.8 units    [provider]  omeprazole (PRILOSEC) 20 MG capsule Take 1 capsule (20 mg total) by mouth 2 (two) times daily before a meal. 03/08/20 04/07/20  Evern Core, PA-C  potassium chloride (K-DUR) 10 MEQ tablet Take 10 mEq by mouth daily.    [provider]  predniSONE (STERAPRED UNI-PAK 21 TAB) 10 MG (21) TBPK tablet Take by mouth daily. Take as directed. 11/14/19   Mardella Layman, MD  predniSONE (STERAPRED UNI-PAK 21 TAB) 10 MG (21) TBPK tablet Follow directions on packaging 03/08/20   Evern Core, PA-C  rosuvastatin (CRESTOR) 20 MG tablet Take 1 tablet (20 mg total) by mouth at bedtime. 08/22/18 08/22/19  Patwardhan, Anabel Bene, MD  Semaglutide (RYBELSUS) 14 MG TABS Take by mouth.    [provider]  traMADol (ULTRAM) 50 MG tablet Take 1 tablet (50 mg total) by mouth at bedtime as needed for moderate pain or severe pain. 11/14/19   Mardella Layman, MD  traMADol (ULTRAM) 50 MG tablet Take 1 tablet (50  mg total) by mouth every 6 (six) hours as needed. 03/08/20   Evern Core, PA-C    Family History Family History  Problem Relation Age of Onset   Hypertension Mother    Hypertension Sister    Diabetes Paternal Grandmother    Breast cancer Maternal Aunt     Social History Social History   Tobacco Use   Smoking status: Former    Packs/day: 0.50    Types: Cigarettes    Quit date: 12/2019    Years since quitting: 0.9   Smokeless tobacco: Never  Vaping Use   Vaping Use: Never used  Substance Use Topics   Alcohol use: Yes    Alcohol/week: 0.0 standard drinks    Comment: socially    Drug use: No     Allergies   Patient has no known allergies.   Review of Systems Review of Systems  As stated above in HPI Physical Exam Triage Vital Signs ED Triage Vitals  Enc Vitals Group     BP 11/09/20 0858 (!) 172/87     Pulse Rate 11/09/20 0858 88     Resp 11/09/20 0858 18     Temp 11/09/20 0858 98.6 F (37 C)     Temp Source 11/09/20 0858 Oral     SpO2 11/09/20 0858 96 %     Weight --      Height --      Head Circumference --      Peak Flow --      Pain Score 11/09/20 0900 9     Pain Loc --      Pain Edu? --      Excl. in GC? --    No data found.  Updated Vital Signs BP (!) 172/87 Comment: has not taken HTN med today  Pulse 88   Temp 98.6 F (37 C) (Oral)   Resp 18   SpO2 96%   Physical Exam Vitals and nursing note reviewed.  Constitutional:      General: She is not in acute distress.    Appearance: Normal appearance. She is not ill-appearing, toxic-appearing or diaphoretic.  HENT:     Head: Normocephalic and atraumatic.  Cardiovascular:     Pulses: Normal pulses.  Musculoskeletal:        General: Tenderness (low back muscles bilaterally) present. No swelling. Normal range of motion.     Right lower leg: No edema.     Left lower leg: No edema.     Comments: Normal straight leg raise bilaterally  Skin:    General: Skin is warm.     Capillary Refill: Capillary refill takes less than 2 seconds.  Neurological:     General: No focal deficit present.     Mental Status: She is alert.     Sensory: No sensory deficit.     Motor: No weakness.     Coordination: Coordination normal.     Deep Tendon Reflexes: Reflexes normal.     UC Treatments / Results  Labs (all labs ordered are listed, but only abnormal results are displayed) Labs Reviewed - No data to display  EKG   Radiology No results found.  Procedures Procedures (including critical care time)  Medications Ordered in UC Medications - No data to display  Initial Impression /  Assessment and Plan / UC Course  I have reviewed the triage vital signs and the nursing notes.  Pertinent labs & imaging results that were available during my care of the patient were reviewed by  me and considered in my medical decision making (see chart for details).     New.  Acute on chronic.  I discussed with patient the importance of going home and taking her blood pressure medication to lower her blood pressure as the Toradol medication can raise it.  Discussed red flag signs and symptoms in regards to this.  Also going to send a muscle relaxer which she has done well with in the past.  Discussed red flag signs and symptoms.  Follow-up as needed Final Clinical Impressions(s) / UC Diagnoses   Final diagnoses:  None   Discharge Instructions   None    ED Prescriptions   None    PDMP not reviewed this encounter.   Rushie Chestnut, [place] 11/09/20 [phone]

## 2020-11-09 NOTE — Discharge Instructions (Addendum)
Sciatica Rehab °Ask your health care provider which exercises are safe for you. Do exercises exactly as told by your health care provider and adjust them as directed. It is normal to feel mild stretching, pulling, tightness, or discomfort as you do these exercises. Stop right away if you feel sudden pain or your pain gets worse. Do not begin these exercises until told by your health care provider. °Stretching and range-of-motion exercises °These exercises warm up your muscles and joints and improve the movement and flexibility of your hips and back. These exercises also help to relieve pain,numbness, and tingling. °Sciatic nerve glide °Sit in a chair with your head facing down toward your chest. Place your hands behind your back. Let your shoulders slump forward. °Slowly straighten one of your legs while you tilt your head back as if you are looking toward the ceiling. Only straighten your leg as far as you can without making your symptoms worse. °Hold this position for ____30______ seconds. °Slowly return to the starting position. °Repeat with your other leg. °Repeat ____5______ times. Complete this exercise _____2_____ times a day. °Knee to chest with hip adduction and internal rotation ° °Lie on your back on a firm surface with both legs straight. °Bend one of your knees and move it up toward your chest until you feel a gentle stretch in your lower back and buttock. Then, move your knee toward the shoulder that is on the opposite side from your leg. This is hip adduction and internal rotation. °Hold your leg in this position by holding on to the front of your knee. °Hold this position for _____30_____ seconds. °Slowly return to the starting position. °Repeat with your other leg. °Repeat ___5_______ times. Complete this exercise ______2____ times a day. °Prone extension on elbows ° °Lie on your abdomen on a firm surface. A bed may be too soft for this exercise. °Prop yourself up on your elbows. °Use your arms to  help lift your chest up until you feel a gentle stretch in your abdomen and your lower back. °This will place some of your body weight on your elbows. If this is uncomfortable, try stacking pillows under your chest. °Your hips should stay down, against the surface that you are lying on. Keep your hip and back muscles relaxed. °Hold this position for _____30_____ seconds. °Slowly relax your upper body and return to the starting position. °Repeat ____5______ times. Complete this exercise _____2_____ times a day. °Strengthening exercises °These exercises build strength and endurance in your back. Endurance is theability to use your muscles for a long time, even after they get tired. °Pelvic tilt °This exercise strengthens the muscles that lie deep in the abdomen. °Lie on your back on a firm surface. Bend your knees and keep your feet flat on the floor. °Tense your abdominal muscles. Tip your pelvis up toward the ceiling and flatten your lower back into the floor. °To help with this exercise, you may place a small towel under your lower back and try to push your back into the towel. °Hold this position for _____30_____ seconds. °Let your muscles relax completely before you repeat this exercise. °Repeat ____5______ times. Complete this exercise ____2______ times a day. °Alternating arm and leg raises ° °Get on your hands and knees on a firm surface. If you are on a hard floor, you may want to use padding, such as an exercise mat, to cushion your knees. °Line up your arms and legs. Your hands should be directly below your shoulders, and your   knees should be directly below your hips. °Lift your left leg behind you. At the same time, raise your right arm and straighten it in front of you. °Do not lift your leg higher than your hip. °Do not lift your arm higher than your shoulder. °Keep your abdominal and back muscles tight. °Keep your hips facing the ground. °Do not arch your back. °Keep your balance carefully, and do not  hold your breath. °Hold this position for ____30______ seconds. °Slowly return to the starting position. °Repeat with your right leg and your left arm. °Repeat ____5______ times. Complete this exercise _____2_____ times a day. °Posture and body mechanics °Good posture and healthy body mechanics can help to relieve stress in your body's tissues and joints. Body mechanics refers to the movements and positions of your body while you do your daily activities. Posture is part of body mechanics. Good posture means: °Your spine is in its natural S-curve position (neutral). °Your shoulders are pulled back slightly. °Your head is not tipped forward. °Follow these guidelines to improve your posture and body mechanics in youreveryday activities. °Standing ° °When standing, keep your spine neutral and your feet about hip width apart. Keep a slight bend in your knees. Your ears, shoulders, and hips should line up. °When you do a task in which you stand in one place for a long time, place one foot up on a stable object that is 2-4 inches (5-10 cm) high, such as a footstool. This helps keep your spine neutral. ° °Sitting ° °When sitting, keep your spine neutral and keep your feet flat on the floor. Use a footrest, if necessary, and keep your thighs parallel to the floor. Avoid rounding your shoulders, and avoid tilting your head forward. °When working at a desk or a computer, keep your desk at a height where your hands are slightly lower than your elbows. Slide your chair under your desk so you are close enough to maintain good posture. °When working at a computer, place your monitor at a height where you are looking straight ahead and you do not have to tilt your head forward or downward to look at the screen. ° °Resting °When lying down and resting, avoid positions that are most painful for you. °If you have pain with activities such as sitting, bending, stooping, or squatting, lie in a position in which your body does not bend  very much. For example, avoid curling up on your side with your arms and knees near your chest (fetal position). °If you have pain with activities such as standing for a long time or reaching with your arms, lie with your spine in a neutral position and bend your knees slightly. Try the following positions: °Lying on your side with a pillow between your knees. °Lying on your back with a pillow under your knees. °Lifting ° °When lifting objects, keep your feet at least shoulder width apart and tighten your abdominal muscles. °Bend your knees and hips and keep your spine neutral. It is important to lift using the strength of your legs, not your back. Do not lock your knees straight out. °Always ask for help to lift heavy or awkward objects. ° °This information is not intended to replace advice given to you by your health care provider. Make sure you discuss any questions you have with your healthcare provider. °Document Revised: 07/11/2018 Document Reviewed: 04/10/2018 °[employer]vier Patient Education © 2022 [employer] ° °

## 2020-12-12 ENCOUNTER — Other Ambulatory Visit: Payer: Self-pay | Admitting: Internal Medicine

## 2020-12-12 DIAGNOSIS — Z1231 Encounter for screening mammogram for malignant neoplasm of breast: Secondary | ICD-10-CM

## 2021-01-18 ENCOUNTER — Other Ambulatory Visit: Payer: Self-pay

## 2021-01-18 ENCOUNTER — Ambulatory Visit
Admission: RE | Admit: 2021-01-18 | Discharge: 2021-01-18 | Disposition: A | Discharge status: Home / Self Care | Payer: BC Managed Care – PPO | Source: Ambulatory Visit | Attending: Internal Medicine | Admitting: Internal Medicine

## 2021-01-18 DIAGNOSIS — Z1231 Encounter for screening mammogram for malignant neoplasm of breast: Secondary | ICD-10-CM

## 2021-03-31 ENCOUNTER — Encounter (HOSPITAL_COMMUNITY): Payer: Self-pay

## 2021-03-31 ENCOUNTER — Other Ambulatory Visit: Payer: Self-pay

## 2021-03-31 ENCOUNTER — Emergency Department (HOSPITAL_COMMUNITY): Payer: BC Managed Care – PPO

## 2021-03-31 ENCOUNTER — Inpatient Hospital Stay (HOSPITAL_COMMUNITY)
Admission: EM | Admit: 2021-03-31 | Discharge: 2021-04-03 | DRG: 871 | Disposition: A | Discharge status: Home / Self Care | Payer: BC Managed Care – PPO | Attending: Internal Medicine | Admitting: Internal Medicine

## 2021-03-31 DIAGNOSIS — Z90711 Acquired absence of uterus with remaining cervical stump: Secondary | ICD-10-CM

## 2021-03-31 DIAGNOSIS — Z8249 Family history of ischemic heart disease and other diseases of the circulatory system: Secondary | ICD-10-CM

## 2021-03-31 DIAGNOSIS — Z87891 Personal history of nicotine dependence: Secondary | ICD-10-CM

## 2021-03-31 DIAGNOSIS — J811 Chronic pulmonary edema: Secondary | ICD-10-CM

## 2021-03-31 DIAGNOSIS — Z6841 Body Mass Index (BMI) 40.0 and over, adult: Secondary | ICD-10-CM | POA: Diagnosis not present

## 2021-03-31 DIAGNOSIS — Z803 Family history of malignant neoplasm of breast: Secondary | ICD-10-CM

## 2021-03-31 DIAGNOSIS — Z794 Long term (current) use of insulin: Secondary | ICD-10-CM

## 2021-03-31 DIAGNOSIS — Z20822 Contact with and (suspected) exposure to covid-19: Secondary | ICD-10-CM | POA: Diagnosis present

## 2021-03-31 DIAGNOSIS — E669 Obesity, unspecified: Secondary | ICD-10-CM | POA: Diagnosis present

## 2021-03-31 DIAGNOSIS — Z79899 Other long term (current) drug therapy: Secondary | ICD-10-CM | POA: Diagnosis not present

## 2021-03-31 DIAGNOSIS — I10 Essential (primary) hypertension: Secondary | ICD-10-CM | POA: Diagnosis present

## 2021-03-31 DIAGNOSIS — I5031 Acute diastolic (congestive) heart failure: Secondary | ICD-10-CM | POA: Diagnosis not present

## 2021-03-31 DIAGNOSIS — B9789 Other viral agents as the cause of diseases classified elsewhere: Secondary | ICD-10-CM | POA: Diagnosis present

## 2021-03-31 DIAGNOSIS — E869 Volume depletion, unspecified: Secondary | ICD-10-CM | POA: Diagnosis present

## 2021-03-31 DIAGNOSIS — J129 Viral pneumonia, unspecified: Secondary | ICD-10-CM | POA: Diagnosis present

## 2021-03-31 DIAGNOSIS — E1169 Type 2 diabetes mellitus with other specified complication: Secondary | ICD-10-CM | POA: Diagnosis present

## 2021-03-31 DIAGNOSIS — Z833 Family history of diabetes mellitus: Secondary | ICD-10-CM | POA: Diagnosis not present

## 2021-03-31 DIAGNOSIS — E872 Acidosis, unspecified: Secondary | ICD-10-CM | POA: Diagnosis present

## 2021-03-31 DIAGNOSIS — R0602 Shortness of breath: Secondary | ICD-10-CM | POA: Diagnosis not present

## 2021-03-31 DIAGNOSIS — Z7952 Long term (current) use of systemic steroids: Secondary | ICD-10-CM

## 2021-03-31 DIAGNOSIS — J1008 Influenza due to other identified influenza virus with other specified pneumonia: Secondary | ICD-10-CM | POA: Diagnosis present

## 2021-03-31 DIAGNOSIS — A4189 Other specified sepsis: Principal | ICD-10-CM | POA: Diagnosis present

## 2021-03-31 DIAGNOSIS — Z9049 Acquired absence of other specified parts of digestive tract: Secondary | ICD-10-CM

## 2021-03-31 DIAGNOSIS — K219 Gastro-esophageal reflux disease without esophagitis: Secondary | ICD-10-CM | POA: Diagnosis not present

## 2021-03-31 DIAGNOSIS — R7989 Other specified abnormal findings of blood chemistry: Secondary | ICD-10-CM | POA: Diagnosis present

## 2021-03-31 DIAGNOSIS — J9601 Acute respiratory failure with hypoxia: Secondary | ICD-10-CM | POA: Diagnosis present

## 2021-03-31 DIAGNOSIS — E876 Hypokalemia: Secondary | ICD-10-CM | POA: Diagnosis present

## 2021-03-31 DIAGNOSIS — R6520 Severe sepsis without septic shock: Secondary | ICD-10-CM | POA: Diagnosis present

## 2021-03-31 DIAGNOSIS — R197 Diarrhea, unspecified: Secondary | ICD-10-CM | POA: Diagnosis present

## 2021-03-31 DIAGNOSIS — E1165 Type 2 diabetes mellitus with hyperglycemia: Secondary | ICD-10-CM | POA: Diagnosis present

## 2021-03-31 DIAGNOSIS — J101 Influenza due to other identified influenza virus with other respiratory manifestations: Secondary | ICD-10-CM | POA: Diagnosis not present

## 2021-03-31 DIAGNOSIS — R0902 Hypoxemia: Secondary | ICD-10-CM

## 2021-03-31 DIAGNOSIS — E782 Mixed hyperlipidemia: Secondary | ICD-10-CM | POA: Diagnosis present

## 2021-03-31 LAB — RESP PANEL BY RT-PCR (FLU A&B, COVID) ARPGX2
Influenza A by PCR: POSITIVE — AB
Influenza B by PCR: NEGATIVE
SARS Coronavirus 2 by RT PCR: NEGATIVE

## 2021-03-31 LAB — CBC WITH DIFFERENTIAL/PLATELET
Abs Immature Granulocytes: 0.03 10*3/uL (ref 0.00–0.07)
Basophils Absolute: 0 10*3/uL (ref 0.0–0.1)
Basophils Relative: 0 %
Eosinophils Absolute: 0 10*3/uL (ref 0.0–0.5)
Eosinophils Relative: 0 %
HCT: 41.3 % (ref 36.0–46.0)
Hemoglobin: 12.7 g/dL (ref 12.0–15.0)
Immature Granulocytes: 0 %
Lymphocytes Relative: 9 %
Lymphs Abs: 0.9 10*3/uL (ref 0.7–4.0)
MCH: 22.2 pg — ABNORMAL LOW (ref 26.0–34.0)
MCHC: 30.8 g/dL (ref 30.0–36.0)
MCV: 72.2 fL — ABNORMAL LOW (ref 80.0–100.0)
Monocytes Absolute: 0.9 10*3/uL (ref 0.1–1.0)
Monocytes Relative: 9 %
Neutro Abs: 7.9 10*3/uL — ABNORMAL HIGH (ref 1.7–7.7)
Neutrophils Relative %: 82 %
Platelets: 177 10*3/uL (ref 150–400)
RBC: 5.72 MIL/uL — ABNORMAL HIGH (ref 3.87–5.11)
RDW: 17.2 % — ABNORMAL HIGH (ref 11.5–15.5)
WBC: 9.7 10*3/uL (ref 4.0–10.5)
nRBC: 0 % (ref 0.0–0.2)

## 2021-03-31 LAB — LACTIC ACID, PLASMA
Lactic Acid, Venous: 2.6 mmol/L (ref 0.5–1.9)
Lactic Acid, Venous: 2.7 mmol/L (ref 0.5–1.9)

## 2021-03-31 LAB — URINALYSIS, ROUTINE W REFLEX MICROSCOPIC
Bacteria, UA: NONE SEEN
Bilirubin Urine: NEGATIVE
Glucose, UA: NEGATIVE mg/dL
Ketones, ur: NEGATIVE mg/dL
Leukocytes,Ua: NEGATIVE
Nitrite: NEGATIVE
Protein, ur: NEGATIVE mg/dL
Specific Gravity, Urine: 1.01 (ref 1.005–1.030)
pH: 5 (ref 5.0–8.0)

## 2021-03-31 LAB — COMPREHENSIVE METABOLIC PANEL
ALT: 33 U/L (ref 0–44)
AST: 42 U/L — ABNORMAL HIGH (ref 15–41)
Albumin: 3.8 g/dL (ref 3.5–5.0)
Alkaline Phosphatase: 97 U/L (ref 38–126)
Anion gap: 11 (ref 5–15)
BUN: 8 mg/dL (ref 6–20)
CO2: 25 mmol/L (ref 22–32)
Calcium: 8.1 mg/dL — ABNORMAL LOW (ref 8.9–10.3)
Chloride: 102 mmol/L (ref 98–111)
Creatinine, Ser: 0.73 mg/dL (ref 0.44–1.00)
GFR, Estimated: >60 mL/min (ref >60)
Glucose, Bld: 140 mg/dL — ABNORMAL HIGH (ref 70–99)
Potassium: 2.7 mmol/L — CL (ref 3.5–5.1)
Sodium: 138 mmol/L (ref 135–145)
Total Bilirubin: 0.7 mg/dL (ref 0.3–1.2)
Total Protein: 7.1 g/dL (ref 6.5–8.1)

## 2021-03-31 LAB — HCG, QUANTITATIVE, PREGNANCY: hCG, Beta Chain, Quant, S: 1 m[IU]/mL (ref <5)

## 2021-03-31 LAB — BLOOD GAS, VENOUS
Acid-Base Excess: 2.2 mmol/L — ABNORMAL HIGH (ref 0.0–2.0)
Bicarbonate: 25.7 mmol/L (ref 20.0–28.0)
O2 Saturation: 97.5 %
Patient temperature: 98.6
pCO2, Ven: 37.7 mmHg — ABNORMAL LOW (ref 44.0–60.0)
pH, Ven: 7.447 — ABNORMAL HIGH (ref 7.250–7.430)
pO2, Ven: 91 mmHg — ABNORMAL HIGH (ref 32.0–45.0)

## 2021-03-31 LAB — CBG MONITORING, ED: Glucose-Capillary: 255 mg/dL — ABNORMAL HIGH (ref 70–99)

## 2021-03-31 LAB — BRAIN NATRIURETIC PEPTIDE: B Natriuretic Peptide: 173 pg/mL — ABNORMAL HIGH (ref 0.0–100.0)

## 2021-03-31 LAB — MAGNESIUM: Magnesium: 2 mg/dL (ref 1.7–2.4)

## 2021-03-31 MED ORDER — ACETAMINOPHEN 500 MG PO TABS
1000.0000 mg | ORAL_TABLET | Freq: Once | ORAL | Status: AC
  Administered 2021-03-31: 18:00:00 1000 mg via ORAL
  Filled 2021-03-31: qty 2

## 2021-03-31 MED ORDER — KCL-LACTATED RINGERS 20 MEQ/L IV SOLN
INTRAVENOUS | Status: AC
  Filled 2021-03-31 (×3): qty 1000

## 2021-03-31 MED ORDER — OSELTAMIVIR PHOSPHATE 75 MG PO CAPS
75.0000 mg | ORAL_CAPSULE | Freq: Two times a day (BID) | ORAL | Status: DC
  Administered 2021-03-31 – 2021-04-03 (×6): 75 mg via ORAL
  Filled 2021-03-31 (×7): qty 1

## 2021-03-31 MED ORDER — ENOXAPARIN SODIUM 40 MG/0.4ML IJ SOSY
40.0000 mg | PREFILLED_SYRINGE | INTRAMUSCULAR | Status: DC
  Administered 2021-03-31 – 2021-04-02 (×3): 40 mg via SUBCUTANEOUS
  Filled 2021-03-31 (×3): qty 0.4

## 2021-03-31 MED ORDER — IOHEXOL 350 MG/ML SOLN
90.0000 mL | Freq: Once | INTRAVENOUS | Status: AC | PRN
  Administered 2021-03-31: 17:00:00 100 mL via INTRAVENOUS

## 2021-03-31 MED ORDER — INSULIN GLARGINE-YFGN 100 UNIT/ML ~~LOC~~ SOLN
60.0000 [IU] | Freq: Every day | SUBCUTANEOUS | Status: DC
  Administered 2021-04-01 – 2021-04-03 (×3): 60 [IU] via SUBCUTANEOUS
  Filled 2021-03-31 (×3): qty 0.6

## 2021-03-31 MED ORDER — PANTOPRAZOLE SODIUM 40 MG PO TBEC
40.0000 mg | DELAYED_RELEASE_TABLET | Freq: Every day | ORAL | Status: DC
  Administered 2021-04-01 – 2021-04-03 (×3): 40 mg via ORAL
  Filled 2021-03-31 (×3): qty 1

## 2021-03-31 MED ORDER — POTASSIUM CHLORIDE CRYS ER 20 MEQ PO TBCR
40.0000 meq | EXTENDED_RELEASE_TABLET | Freq: Once | ORAL | Status: AC
  Administered 2021-03-31: 18:00:00 40 meq via ORAL
  Filled 2021-03-31: qty 2

## 2021-03-31 MED ORDER — HYDRALAZINE HCL 20 MG/ML IJ SOLN: 10.0000 mg | Freq: Four times a day (QID) | INTRAMUSCULAR | Status: DC | PRN

## 2021-03-31 MED ORDER — ACETAMINOPHEN 650 MG RE SUPP: 650.0000 mg | Freq: Four times a day (QID) | RECTAL | Status: DC | PRN

## 2021-03-31 MED ORDER — LACTATED RINGERS IV BOLUS: 1000.0000 mL | Freq: Once | INTRAVENOUS | Status: DC

## 2021-03-31 MED ORDER — INSULIN GLARGINE 100 UNIT/ML SOLOSTAR PEN: 60.0000 [IU] | PEN_INJECTOR | Freq: Every day | SUBCUTANEOUS | Status: DC

## 2021-03-31 MED ORDER — ROSUVASTATIN CALCIUM 10 MG PO TABS
20.0000 mg | ORAL_TABLET | Freq: Every day | ORAL | Status: DC
  Administered 2021-03-31 – 2021-04-02 (×3): 20 mg via ORAL
  Filled 2021-03-31: qty 1
  Filled 2021-03-31: qty 2
  Filled 2021-03-31: qty 1

## 2021-03-31 MED ORDER — LACTATED RINGERS IV BOLUS
1000.0000 mL | Freq: Once | INTRAVENOUS | Status: AC
  Administered 2021-03-31: 18:00:00 1000 mL via INTRAVENOUS

## 2021-03-31 MED ORDER — POLYETHYLENE GLYCOL 3350 17 G PO PACK
17.0000 g | PACK | Freq: Every day | ORAL | Status: DC | PRN
  Administered 2021-04-03: 17 g via ORAL
  Filled 2021-03-31: qty 1

## 2021-03-31 MED ORDER — ONDANSETRON HCL 4 MG/2ML IJ SOLN
4.0000 mg | Freq: Four times a day (QID) | INTRAMUSCULAR | Status: DC | PRN
  Administered 2021-04-01: 4 mg via INTRAVENOUS
  Filled 2021-03-31 (×2): qty 2

## 2021-03-31 MED ORDER — MAGNESIUM SULFATE 2 GM/50ML IV SOLN
2.0000 g | Freq: Once | INTRAVENOUS | Status: AC
  Administered 2021-03-31: 18:00:00 2 g via INTRAVENOUS
  Filled 2021-03-31: qty 50

## 2021-03-31 MED ORDER — ACETAMINOPHEN 325 MG PO TABS
650.0000 mg | ORAL_TABLET | Freq: Four times a day (QID) | ORAL | Status: DC | PRN
  Administered 2021-04-01 – 2021-04-02 (×2): 650 mg via ORAL
  Filled 2021-03-31 (×2): qty 2

## 2021-03-31 MED ORDER — ONDANSETRON HCL 4 MG PO TABS
4.0000 mg | ORAL_TABLET | Freq: Four times a day (QID) | ORAL | Status: DC | PRN
  Administered 2021-04-02: 4 mg via ORAL
  Filled 2021-03-31: qty 1

## 2021-03-31 MED ORDER — INSULIN ASPART 100 UNIT/ML IJ SOLN
0.0000 [IU] | Freq: Three times a day (TID) | INTRAMUSCULAR | Status: DC
  Administered 2021-03-31: 22:00:00 8 [IU] via SUBCUTANEOUS
  Administered 2021-04-01: 3 [IU] via SUBCUTANEOUS
  Administered 2021-04-01: 2 [IU] via SUBCUTANEOUS
  Administered 2021-04-01 (×2): 3 [IU] via SUBCUTANEOUS
  Administered 2021-04-02: 2 [IU] via SUBCUTANEOUS
  Administered 2021-04-02 (×2): 3 [IU] via SUBCUTANEOUS
  Filled 2021-03-31: qty 0.15

## 2021-03-31 MED ORDER — ALBUTEROL SULFATE (2.5 MG/3ML) 0.083% IN NEBU: 2.5000 mg | INHALATION_SOLUTION | RESPIRATORY_TRACT | Status: DC | PRN

## 2021-03-31 MED ORDER — POTASSIUM CHLORIDE CRYS ER 20 MEQ PO TBCR
40.0000 meq | EXTENDED_RELEASE_TABLET | Freq: Once | ORAL | Status: AC
  Administered 2021-03-31: 22:00:00 40 meq via ORAL
  Filled 2021-03-31: qty 2

## 2021-03-31 MED ORDER — POTASSIUM CHLORIDE 10 MEQ/100ML IV SOLN
10.0000 meq | INTRAVENOUS | Status: AC
  Administered 2021-03-31: 18:00:00 10 meq via INTRAVENOUS
  Filled 2021-03-31 (×2): qty 100

## 2021-03-31 NOTE — Assessment & Plan Note (Signed)
·   We will resume home regimen of statin therapy once home medication reconciliation is complete

## 2021-03-31 NOTE — Assessment & Plan Note (Signed)
·   Notable lactic acidosis on arrival, likely secondary to volume depletion and underlying infection  Hydrating patient with intravenous isotonic fluids as well as treating influenza with Tamiflu  Perform serial lactic acid levels to ensure downtrending and resolution

## 2021-03-31 NOTE — Assessment & Plan Note (Signed)
·   Replacing with potassium chloride via both oral and intravenous means  No evidence of associated hypomagnesemia  Monitoring potassium levels with serial chemistries.

## 2021-03-31 NOTE — H&P (Signed)
History and Physical    Jaime Daugherty YNW:[id number] DOB: 02/10/1971 DOA: 03/31/2021  PCP: Ralene Ok, MD  Patient coming from: Home via EMS   Chief Complaint:  Chief Complaint  Patient presents with   Shortness of Breath     HPI:    50 year old female with past medical history of hypertension, insulin-dependent diabetes mellitus type 2, obesity, hyperlipidemia, gastroesophageal reflux disease who presents to [facility] emergency department via EMS with complaints of shortness of breath and diarrhea.  Patient claims that approximately 2 days ago she began to experience generalized body aches.  The body aches were moderate to severe in intensity and became rapidly associated with nasal congestion, nausea, bouts of watery diarrhea and severe cough.  Over the initial 24 hours patient's symptoms rapidly worsened and patient was evaluated in a local urgent care clinic on 12/29.  During that visit patient was prescribed an albuterol inhaler and antitussive and was sent home.  Patient explains that over the following 24 hours patient continued to rapidly worsen and became to develop associated shortness of breath.  Shortness of breath was initially mild in intensity but rapidly became more more severe.  Shortness of breath is worse with exertion and improved with rest.  Patient denies any paroxysmal nocturnal dyspnea, pillow orthopnea, peripheral edema or chest pain.  Patient denies any recent travel or contact with confirmed COVID-19 infection.  Patient symptoms continue to worsen until patient contacted EMS the afternoon of 12/30.  According to EMS, upon initial evaluation patient was saturating at 77% on room air.  Patient was promptly brought into [facility] emergency department for evaluation.  In route the patient was provided with Solu-Medrol, nebulized DuoNeb and placed on nonrebreather mask.   Upon evaluation in the emergency department patient was found to be  influenza A positive via PCR testing.  COVID-19 testing was found to be negative.  Patient was found to exhibit multiple SIRS criteria including tachycardia, tachypnea, fever of 102.4 F.  Patient was found to have an associated lactic acidosis of 2.6 and was clinically found to be volume depleted with associated severe hyperkalemia with potassium of 2.7.  Initial chest x-ray was unremarkable however this was followed up with a CT angiogram of the chest which revealed no evidence of pulmonary embolism but did reveal patchy bilateral groundglass airspace disease concerning for viral pneumonia or pneumonitis.  Patient was given 1 L of lactated Ringer solution, 2 g of magnesium sulfate intravenously as well as 30 mill equivalents of potassium chloride.  The hospitalist group was then called to assess the patient for admission to the hospital.  Review of Systems:   Review of Systems  Constitutional:  Positive for malaise/fatigue.  Respiratory:  Positive for cough and shortness of breath.   Gastrointestinal:  Positive for diarrhea.  Neurological:  Positive for weakness.  All other systems reviewed and are negative.  Past Medical History:  Diagnosis Date   Chest pain of uncertain etiology 08/22/2018   Diabetes mellitus without complication (HCC)    Hypertension     Past Surgical History:  Procedure Laterality Date   ABDOMINAL HYSTERECTOMY     CHOLECYSTECTOMY       reports that she quit smoking about 15 months ago. Her smoking use included cigarettes. She smoked an average of .5 packs per day. She has never used smokeless tobacco. She reports current alcohol use. She reports that she does not use drugs.  No Known Allergies  Family History  Problem Relation Age  of Onset   Hypertension Mother    Hypertension Sister    Diabetes Paternal Grandmother    Breast cancer Maternal Aunt      Prior to Admission medications   Medication Sig Start Date End Date Taking? Authorizing Provider   albuterol (PROVENTIL HFA;VENTOLIN HFA) 108 (90 Base) MCG/ACT inhaler Inhale 2 puffs into the lungs every 6 (six) hours as needed for wheezing or shortness of breath. 02/06/18   Elvina Sidle, MD  Dulaglutide (TRULICITY [place]) Inject into the skin.    [provider]  furosemide (LASIX) 40 MG tablet Take 40 mg by mouth every other day. 11/02/17   [provider]  Insulin Glargine (LANTUS SOLOSTAR) 100 UNIT/ML Solostar Pen Inject 30 Units into the skin daily. Patient taking differently: Inject 60 Units into the skin daily. 01/04/17   Romero Belling, MD  Liraglutide (VICTOZA [place]) Inject into the skin daily. 1.8 units    [provider]  lisinopril-hydrochlorothiazide (ZESTORETIC) 20-12.5 MG tablet Take 1 tablet by mouth daily.     [provider]  methocarbamol (ROBAXIN) 500 MG tablet Take 1 tablet (500 mg total) by mouth 2 (two) times daily. 11/09/20   Rushie Chestnut, PA-C  omeprazole (PRILOSEC) 20 MG capsule Take 1 capsule (20 mg total) by mouth 2 (two) times daily before a meal. 03/08/20 04/07/20  Evern Core, PA-C  potassium chloride (K-DUR) 10 MEQ tablet Take 10 mEq by mouth daily.    [provider]  predniSONE (STERAPRED UNI-PAK 21 TAB) 10 MG (21) TBPK tablet Take by mouth daily. Take as directed. 11/14/19   Mardella Layman, MD  predniSONE (STERAPRED UNI-PAK 21 TAB) 10 MG (21) TBPK tablet Follow directions on packaging 03/08/20   Evern Core, PA-C  rosuvastatin (CRESTOR) 20 MG tablet Take 1 tablet (20 mg total) by mouth at bedtime. 08/22/18 08/22/19  Patwardhan, Anabel Bene, MD  Semaglutide (RYBELSUS) 14 MG TABS Take by mouth.    [provider]  traMADol (ULTRAM) 50 MG tablet Take 1 tablet (50 mg total) by mouth at bedtime as needed for moderate pain or severe pain. 11/14/19   Mardella Layman, MD  traMADol (ULTRAM) 50 MG tablet Take 1 tablet (50 mg total) by mouth every 6 (six) hours as needed. 03/08/20   Evern Core, PA-C    Physical  Exam: Vitals:   03/31/21 1745 03/31/21 1800 03/31/21 1806 03/31/21 1851  BP: (!) 143/72 140/70    Pulse: (!) 116 (!) 113 (!) 109   Resp: (!) 27 (!) 30 (!) 25   Temp:    99.8 F (37.7 C)  TempSrc:    Oral  SpO2: 98% 92% 96%     Constitutional: Awake alert and oriented x3, mild respiratory distress Skin: no rashes, no lesions, good skin turgor noted. Eyes: Pupils are equally reactive to light.  No evidence of scleral icterus or conjunctival pallor.  ENMT: Moist mucous membranes noted.  Posterior pharynx clear of any exudate or lesions.   Neck: normal, supple, no masses, no thyromegaly.  No evidence of jugular venous distension.   Respiratory: Notable bibasilar rales with intermittent expiratory wheezing heard in all fields.  Increased respiratory effort without accessory muscle use.  Cardiovascular: Regular rate and rhythm, no murmurs / rubs / gallops. No extremity edema. 2+ pedal pulses. No carotid bruits.  Chest:   Nontender without crepitus or deformity.   Back:   Nontender without crepitus or deformity. Abdomen: Abdomen is soft and nontender.  No evidence of intra-abdominal masses.  Positive bowel sounds  noted in all quadrants.   Musculoskeletal: No joint deformity upper and lower extremities. Good ROM, no contractures. Normal muscle tone.  Neurologic: CN 2-12 grossly intact. Sensation intact.  Patient moving all 4 extremities spontaneously.  Patient is following all commands.  Patient is responsive to verbal stimuli.   Psychiatric: Patient exhibits normal mood with appropriate affect.  Patient seems to possess insight as to their current situation.     Labs on Admission: I have personally reviewed following labs and imaging studies -   CBC: Recent Labs  Lab 03/31/21 1611  WBC 9.7  NEUTROABS 7.9*  HGB 12.7  HCT 41.3  MCV 72.2*  PLT 177   Basic Metabolic Panel: Recent Labs  Lab 03/31/21 1611 03/31/21 1712  NA 138  --   K 2.7*  --   CL 102  --   CO2 25  --   GLUCOSE  140*  --   BUN 8  --   CREATININE 0.73  --   CALCIUM 8.1*  --   MG  --  2.0   GFR: CrCl cannot be calculated (Unknown ideal weight.). Liver Function Tests: Recent Labs  Lab 03/31/21 1611  AST 42*  ALT 33  ALKPHOS 97  BILITOT 0.7  PROT 7.1  ALBUMIN 3.8   No results for input(s): LIPASE, AMYLASE in the last 168 hours. No results for input(s): AMMONIA in the last 168 hours. Coagulation Profile: No results for input(s): INR, PROTIME in the last 168 hours. Cardiac Enzymes: No results for input(s): CKTOTAL, CKMB, CKMBINDEX, TROPONINI in the last 168 hours. BNP (last 3 results) No results for input(s): PROBNP in the last 8760 hours. HbA1C: No results for input(s): HGBA1C in the last 72 hours. CBG: No results for input(s): GLUCAP in the last 168 hours. Lipid Profile: No results for input(s): CHOL, HDL, LDLCALC, TRIG, CHOLHDL, LDLDIRECT in the last 72 hours. Thyroid Function Tests: No results for input(s): TSH, T4TOTAL, FREET4, T3FREE, THYROIDAB in the last 72 hours. Anemia Panel: No results for input(s): VITAMINB12, FOLATE, FERRITIN, TIBC, IRON, RETICCTPCT in the last 72 hours. Urine analysis:    Component Value Date/Time   COLORURINE COLORLESS (A) 03/31/2021 1819   APPEARANCEUR CLEAR 03/31/2021 1819   LABSPEC 1.010 03/31/2021 1819   PHURINE 5.0 03/31/2021 1819   GLUCOSEU NEGATIVE 03/31/2021 1819   HGBUR SMALL (A) 03/31/2021 1819   BILIRUBINUR NEGATIVE 03/31/2021 1819   BILIRUBINUR negative 06/16/2015 0914   BILIRUBINUR neg 11/12/2013 0930   KETONESUR NEGATIVE 03/31/2021 1819   PROTEINUR NEGATIVE 03/31/2021 1819   UROBILINOGEN 0.2 03/08/2020 1644   NITRITE NEGATIVE 03/31/2021 1819   LEUKOCYTESUR NEGATIVE 03/31/2021 1819    Radiological Exams on Admission - Personally Reviewed: DG Chest 2 View  Result Date: 03/31/2021 CLINICAL DATA:  A 50 year old female presents for evaluation of shortness of breath. EXAM: CHEST - 2 VIEW COMPARISON:  Comparison is made with  February 06, 2018. FINDINGS: EKG leads project over the chest. Study limited by patient body habitus and portable technique. Trachea midline. Cardiomediastinal contours and hilar structures are stable. No signs of lobar consolidation. No visible pneumothorax. No sign of pleural effusion. On limited assessment there is no acute skeletal process. IMPRESSION: No acute cardiopulmonary disease. Electronically Signed   By: Donzetta Kohut M.D.   On: 03/31/2021 16:46   CT Angio Chest PE W and/or Wo Contrast  Result Date: 03/31/2021 CLINICAL DATA:  Abdominal pain Sudden onset of increased shortness of breath for 2.5 hours EXAM: CT ANGIOGRAPHY CHEST CT ABDOMEN AND PELVIS  WITH CONTRAST TECHNIQUE: Multidetector CT imaging of the chest was performed using the standard protocol during bolus administration of intravenous contrast. Multiplanar CT image reconstructions and MIPs were obtained to evaluate the vascular anatomy. Multidetector CT imaging of the abdomen and pelvis was performed using the standard protocol during bolus administration of intravenous contrast. CONTRAST:  OMNIPAQUE IOHEXOL 350 MG/ML SOLN COMPARISON:  03/31/2021, 06/05/2018 FINDINGS: CTA CHEST FINDINGS Cardiovascular: This is a technically adequate evaluation of the pulmonary vasculature. No filling defects or pulmonary emboli. The heart is unremarkable without pericardial effusion. No evidence of thoracic aortic aneurysm or dissection. Mediastinum/Nodes: Right paratracheal adenopathy measures up to 17 mm in short axis, reference image 27/1. Right hilar lymph node measures up to 12 mm in short axis, reference image 42/1. No other pathologic adenopathy within the mediastinum or hila. Thyroid, trachea, and esophagus appear unremarkable. Lungs/Pleura: There are patchy scattered ground-glass opacities throughout the lungs bilaterally, in a pattern that suggests atypical infection such as COVID-19. No effusion or pneumothorax. Central airways are patent.  Musculoskeletal: No acute or destructive bony lesions. Reconstructed images demonstrate no additional findings. Review of the MIP images confirms the above findings. CT ABDOMEN and PELVIS FINDINGS Hepatobiliary: No focal liver abnormality is seen. Status post cholecystectomy. No biliary dilatation. Pancreas: Unremarkable. No pancreatic ductal dilatation or surrounding inflammatory changes. Spleen: Normal in size without focal abnormality. Adrenals/Urinary Tract: Fat containing right adrenal mass measuring 5.0 x 5.5 x 5.5 cm consistent with myelolipoma, slightly increased in size since prior study. Left adrenal is stable. The kidneys enhance normally and symmetrically. No urinary tract calculi or obstructive uropathy. Bladder is unremarkable. Stomach/Bowel: No bowel obstruction or ileus. Normal appendix right lower quadrant. No bowel wall thickening or inflammatory change. Vascular/Lymphatic: Aortic atherosclerosis. No enlarged abdominal or pelvic lymph nodes. Reproductive: Postsurgical changes from supracervical hysterectomy. No adnexal masses. Other: No free fluid or free gas.  No abdominal wall hernia. Musculoskeletal: No acute or destructive bony lesions. Reconstructed images demonstrate no additional findings. Review of the MIP images confirms the above findings. IMPRESSION: 1. No evidence of pulmonary embolus. 2. Patchy bilateral ground-glass airspace disease, most consistent with inflammation or infection. Pattern is typical for COVID-19. 3. Mediastinal and right hilar adenopathy, likely reactive. 4. No acute intra-abdominal or intrapelvic process. 5. 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 No evidence of associated hemorrhage. Nonemergent surgical follow-up recommended. Electronically Signed   By: Sharlet Salina M.D.   On: 03/31/2021 17:55   CT ABDOMEN PELVIS W CONTRAST  Result Date: 03/31/2021 CLINICAL DATA:  Abdominal pain Sudden onset of increased shortness of breath for 2.5  hours EXAM: CT ANGIOGRAPHY CHEST CT ABDOMEN AND PELVIS WITH CONTRAST TECHNIQUE: Multidetector CT imaging of the chest was performed using the standard protocol during bolus administration of intravenous contrast. Multiplanar CT image reconstructions and MIPs were obtained to evaluate the vascular anatomy. Multidetector CT imaging of the abdomen and pelvis was performed using the standard protocol during bolus administration of intravenous contrast. CONTRAST:  OMNIPAQUE IOHEXOL 350 MG/ML SOLN COMPARISON:  03/31/2021, 06/05/2018 FINDINGS: CTA CHEST FINDINGS Cardiovascular: This is a technically adequate evaluation of the pulmonary vasculature. No filling defects or pulmonary emboli. The heart is unremarkable without pericardial effusion. No evidence of thoracic aortic aneurysm or dissection. Mediastinum/Nodes: Right paratracheal adenopathy measures up to 17 mm in short axis, reference image 27/1. Right hilar lymph node measures up to 12 mm in short axis, reference image 42/1. No other pathologic adenopathy within the mediastinum or hila. Thyroid, trachea,  and esophagus appear unremarkable. Lungs/Pleura: There are patchy scattered ground-glass opacities throughout the lungs bilaterally, in a pattern that suggests atypical infection such as COVID-19. No effusion or pneumothorax. Central airways are patent. Musculoskeletal: No acute or destructive bony lesions. Reconstructed images demonstrate no additional findings. Review of the MIP images confirms the above findings. CT ABDOMEN and PELVIS FINDINGS Hepatobiliary: No focal liver abnormality is seen. Status post cholecystectomy. No biliary dilatation. Pancreas: Unremarkable. No pancreatic ductal dilatation or surrounding inflammatory changes. Spleen: Normal in size without focal abnormality. Adrenals/Urinary Tract: Fat containing right adrenal mass measuring 5.0 x 5.5 x 5.5 cm consistent with myelolipoma, slightly increased in size since prior study. Left adrenal  is stable. The kidneys enhance normally and symmetrically. No urinary tract calculi or obstructive uropathy. Bladder is unremarkable. Stomach/Bowel: No bowel obstruction or ileus. Normal appendix right lower quadrant. No bowel wall thickening or inflammatory change. Vascular/Lymphatic: Aortic atherosclerosis. No enlarged abdominal or pelvic lymph nodes. Reproductive: Postsurgical changes from supracervical hysterectomy. No adnexal masses. Other: No free fluid or free gas.  No abdominal wall hernia. Musculoskeletal: No acute or destructive bony lesions. Reconstructed images demonstrate no additional findings. Review of the MIP images confirms the above findings. IMPRESSION: 1. No evidence of pulmonary embolus. 2. Patchy bilateral ground-glass airspace disease, most consistent with inflammation or infection. Pattern is typical for COVID-19. 3. Mediastinal and right hilar adenopathy, likely reactive. 4. No acute intra-abdominal or intrapelvic process. 5. 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 No evidence of associated hemorrhage. Nonemergent surgical follow-up recommended. Electronically Signed   By: Sharlet Salina M.D.   On: 03/31/2021 17:55    EKG: Personally reviewed.  Rhythm is sinus tachycardia with heart rate of 122 bpm.  No dynamic ST segment changes appreciated.  Assessment/Plan  * Acute respiratory failure with hypoxia (HCC)- (present on admission) Patient suffering from acute hypoxic respiratory failure as evidenced by substantial observed hypoxia with oxygen saturations in the 70s requiring significant amounts of submental oxygen Thought to be secondary to influenza A infection with viral pneumonia Submental oxygen to maintain oxygen saturations of 92% and higher. Treating underlying influenza with Tamiflu Bronchodilator therapy for associated wheezing and shortness of breath Weaning supplemental oxygen as tolerated  Sepsis, viral (HCC)- (present on admission) Patient  exhibiting multiple SIRS criteria with associated organ dysfunction including acute hypoxic respiratory failure and lactic acidosis secondary to underlying influenza A infection all consistent with sepsis Treating underlying sepsis with Tamiflu and aggressive intravenous volume resuscitation Blood cultures obtained Procalcitonin pending Monitoring closely for clinical improvement  Influenza A- (present on admission) Influenza A PCR testing found to be positive Treating with Tamiflu, supplemental oxygen and bronchodilator therapy Remainder of assessment and plan as above.  Lactic acidosis- (present on admission) Notable lactic acidosis on arrival, likely secondary to volume depletion and underlying infection Hydrating patient with intravenous isotonic fluids as well as treating influenza with Tamiflu Perform serial lactic acid levels to ensure downtrending and resolution  Acute diarrhea- (present on admission) Patient has developed acute watery diarrhea as of late likely secondary to influenza A infection Symptoms will likely be self-limiting and slowly resolve as patient's illness improves Monitoring electrolytes and replacing associated electrolyte abnormalities  Hypokalemia due to excessive gastrointestinal loss of potassium- (present on admission) Replacing with potassium chloride via both oral and intravenous means No evidence of associated hypomagnesemia Monitoring potassium levels with serial chemistries.   Essential hypertension- (present on admission) Will need to confirm home regimen of antihypertensive therapy as medication reconciliation is  yet to be complete Providing patient with as needed intravenous antihypertensives for markedly elevated blood pressure  Type 2 diabetes mellitus with hyperglycemia, with long-term current use of insulin (HCC) Per review of outpatient records patient is a longstanding known history of poorly controlled diabetes mellitus Patient been  placed on Accu-Cheks before every meal and nightly with sliding scale insulin Home hypoglycemic regimen including home insulin regimen has yet to be reconciled but will be resumed once regimen is confirmed Hemoglobin A1C ordered Diabetic Diet   Mixed hyperlipidemia due to type 2 diabetes mellitus (HCC)- (present on admission) We will resume home regimen of statin therapy once home medication reconciliation is complete  GERD without esophagitis- (present on admission) Continuing home regimen of daily PPI therapy.   Elevated brain natriuretic peptide (BNP) level- (present on admission) Atypical mild infiltrates on CT imaging along with slightly elevated BNP No obvious evidence of acute cardiogenic volume overload however considering patient's shortness of breath and hypoxia will obtain echocardiogram      Code Status:  Full code  code status decision has been confirmed with: Patient Family Communication: mother is at bedside who has been updated on plan of care.    Status is: Inpatient  Remains inpatient appropriate because: Sepsis, probably viral secondary to influenza A infection requiring aggressive intravenous volume resuscitation, supplemental oxygen, bronchodilator therapy and close clinical monitoring.        Marinda Elk MD [facility] Pager [phone]  If 7PM-7AM, please contact night-coverage [email or web address] Use universal [place] password for that web site. If you do not have the password, please call the hospital operator.  03/31/2021, 8:12 PM

## 2021-03-31 NOTE — Sepsis Progress Note (Signed)
Elink Following Code Sepsis  Notified provider of need to order antibiotics. No Abx ordered per MD d/t likely viral sepsis from influenza. Tamiflu for now

## 2021-03-31 NOTE — ED Triage Notes (Signed)
Pt BIB EMS from home. Pt had sudden onset of increased SHOB about 2.5 hours ago. Pt also has diarrhea. Pt was seen at [facility] yesterday and was given albuterol inhaler, which she reports has not helped. HR increased BP dropped upon standing.  CBG 138  77% RA upon EMS arrival 100% on 8L NB Albuterol 5mg  neb given  Duoneb given Solumedrol given 4mg  Zofran given

## 2021-03-31 NOTE — Assessment & Plan Note (Signed)
·   Patient has developed acute watery diarrhea as of late likely secondary to influenza A infection  Symptoms will likely be self-limiting and slowly resolve as patient's illness improves  Monitoring electrolytes and replacing associated electrolyte abnormalities

## 2021-03-31 NOTE — Assessment & Plan Note (Signed)
•   Per review of outpatient records patient is a longstanding known history of poorly controlled diabetes mellitus  Patient been placed on Accu-Cheks before every meal and nightly with sliding scale insulin  Home hypoglycemic regimen including home insulin regimen has yet to be reconciled but will be resumed once regimen is confirmed  Hemoglobin A1C ordered  Diabetic Diet

## 2021-03-31 NOTE — Assessment & Plan Note (Signed)
·   Will need to confirm home regimen of antihypertensive therapy as medication reconciliation is yet to be complete  Providing patient with as needed intravenous antihypertensives for markedly elevated blood pressure

## 2021-03-31 NOTE — ED Provider Notes (Signed)
[facility]-EMERGENCY DEPT Provider Note   CSN: [id number] Arrival date & time: 03/31/21  1555     History Chief Complaint  Patient presents with   Shortness of Breath    Jaime Daugherty is a 50 y.o. female.  HPI Patient is a 50 year old female with history of hypertension, diabetes mellitus, obesity, pedal edema, who presents to the emergency department due to cough, shortness of breath, lightheadedness, weakness.  Patient states about 2 days ago she began experiencing body aches, fevers, diarrhea, nausea, cough, rhinorrhea.  Patient states she was evaluated in urgent care yesterday and discharged with an albuterol inhaler as well as a cough medication.  She denies any improvement.  She states that she began experiencing shortness of breath last night which has progressively worsened.  EMS was called and patient was found to be 77% on room air upon arrival.  She was given 5 mg albuterol nebulizer, DuoNeb, Solu-Medrol, as well as 4 mg of Zofran.  Patient reports continued shortness of breath.  No chest pain.  She states that she takes Lasix as needed for pedal edema but denies a history of CHF.    Past Medical History:  Diagnosis Date   Chest pain of uncertain etiology 08/22/2018   Diabetes mellitus without complication [employer])    Hypertension     Patient Active Problem List   Diagnosis Date Noted   Uncontrolled type 2 diabetes mellitus with hyperglycemia (HCC) 08/23/2018   Morbid obesity (HCC) 08/23/2018   Chest pain of uncertain etiology 08/22/2018   Diabetes (HCC) 08/26/2013   Essential hypertension 06/25/2011    Past Surgical History:  Procedure Laterality Date   ABDOMINAL HYSTERECTOMY     CHOLECYSTECTOMY       OB History   No obstetric history on file.     Family History  Problem Relation Age of Onset   Hypertension Mother    Hypertension Sister    Diabetes Paternal Grandmother    Breast cancer Maternal Aunt     Social History   Tobacco  Use   Smoking status: Former    Packs/day: 0.50    Types: Cigarettes    Quit date: 12/2019    Years since quitting: 1.3   Smokeless tobacco: Never  Vaping Use   Vaping Use: Never used  Substance Use Topics   Alcohol use: Yes    Alcohol/week: 0.0 standard drinks    Comment: socially   Drug use: No    Home Medications Prior to Admission medications   Medication Sig Start Date End Date Taking? Authorizing Provider  albuterol (PROVENTIL HFA;VENTOLIN HFA) 108 (90 Base) MCG/ACT inhaler Inhale 2 puffs into the lungs every 6 (six) hours as needed for wheezing or shortness of breath. 02/06/18   Elvina Sidle, MD  Dulaglutide (TRULICITY [place]) Inject into the skin.    [provider]  furosemide (LASIX) 40 MG tablet Take 40 mg by mouth every other day. 11/02/17   [provider]  Insulin Glargine (LANTUS SOLOSTAR) 100 UNIT/ML Solostar Pen Inject 30 Units into the skin daily. Patient taking differently: Inject 60 Units into the skin daily. 01/04/17   Romero Belling, MD  Liraglutide (VICTOZA [place]) Inject into the skin daily. 1.8 units    [provider]  lisinopril-hydrochlorothiazide (ZESTORETIC) 20-12.5 MG tablet Take 1 tablet by mouth daily.     [provider]  methocarbamol (ROBAXIN) 500 MG tablet Take 1 tablet (500 mg total) by mouth 2 (two) times daily. 11/09/20   Rushie Chestnut,  PA-C  omeprazole (PRILOSEC) 20 MG capsule Take 1 capsule (20 mg total) by mouth 2 (two) times daily before a meal. 03/08/20 04/07/20  Evern Core, PA-C  potassium chloride (K-DUR) 10 MEQ tablet Take 10 mEq by mouth daily.    [provider]  predniSONE (STERAPRED UNI-PAK 21 TAB) 10 MG (21) TBPK tablet Take by mouth daily. Take as directed. 11/14/19   Mardella Layman, MD  predniSONE (STERAPRED UNI-PAK 21 TAB) 10 MG (21) TBPK tablet Follow directions on packaging 03/08/20   Evern Core, PA-C  rosuvastatin (CRESTOR) 20 MG tablet Take 1 tablet (20 mg total) by mouth at bedtime.  08/22/18 08/22/19  Patwardhan, Anabel Bene, MD  Semaglutide (RYBELSUS) 14 MG TABS Take by mouth.    [provider]  traMADol (ULTRAM) 50 MG tablet Take 1 tablet (50 mg total) by mouth at bedtime as needed for moderate pain or severe pain. 11/14/19   Mardella Layman, MD  traMADol (ULTRAM) 50 MG tablet Take 1 tablet (50 mg total) by mouth every 6 (six) hours as needed. 03/08/20   Evern Core, PA-C    Allergies    Patient has no known allergies.  Review of Systems   Review of Systems  All other systems reviewed and are negative. Ten systems reviewed and are negative for acute change, except as noted in the HPI.   Physical Exam Updated Vital Signs BP 140/70    Pulse (!) 109    Temp 99.8 F (37.7 C) (Oral)    Resp (!) 25    SpO2 96%   Physical Exam Vitals and nursing note reviewed.  Constitutional:      General: She is not in acute distress.    Appearance: Normal appearance. She is not ill-appearing, toxic-appearing or diaphoretic.     Interventions: She is not intubated. HENT:     Head: Normocephalic and atraumatic.     Right Ear: External ear normal.     Left Ear: External ear normal.     Nose: Nose normal.     Mouth/Throat:     Mouth: Mucous membranes are moist.     Pharynx: Oropharynx is clear. No oropharyngeal exudate or posterior oropharyngeal erythema.  Eyes:     Extraocular Movements: Extraocular movements intact.  Cardiovascular:     Rate and Rhythm: Normal rate and regular rhythm.     Pulses: Normal pulses.     Heart sounds: Normal heart sounds. No murmur heard.   No friction rub. No gallop.  Pulmonary:     Effort: Tachypnea and respiratory distress present. No bradypnea or accessory muscle usage. She is not intubated.     Breath sounds: No stridor. Examination of the right-lower field reveals decreased breath sounds. Examination of the left-lower field reveals decreased breath sounds. Decreased breath sounds present. No wheezing, rhonchi or rales.     Comments:  Diminished breath sounds.  Oxygen saturations at 98% on 3 L via nasal cannula.   Abdominal:     General: Abdomen is flat.     Palpations: Abdomen is soft.     Tenderness: There is abdominal tenderness.     Comments: Protuberant abdomen that is soft.  Moderate tenderness noted diffusely across the abdomen.  Musculoskeletal:        General: Normal range of motion.     Cervical back: Normal range of motion and neck supple. No tenderness.     Comments: Trace pedal edema.  Skin:    General: Skin is warm and dry.  Neurological:  General: No focal deficit present.     Mental Status: She is alert and oriented to person, place, and time.  Psychiatric:        Mood and Affect: Mood normal.        Behavior: Behavior normal.   ED Results / Procedures / Treatments   Labs (all labs ordered are listed, but only abnormal results are displayed) Labs Reviewed  RESP PANEL BY RT-PCR (FLU A&B, COVID) ARPGX2 - Abnormal; Notable for the following components:      Result Value   Influenza A by PCR POSITIVE (*)    All other components within normal limits  LACTIC ACID, PLASMA - Abnormal; Notable for the following components:   Lactic Acid, Venous 2.6 (*)    All other components within normal limits  COMPREHENSIVE METABOLIC PANEL - Abnormal; Notable for the following components:   Potassium 2.7 (*)    Glucose, Bld 140 (*)    Calcium 8.1 (*)    AST 42 (*)    All other components within normal limits  CBC WITH DIFFERENTIAL/PLATELET - Abnormal; Notable for the following components:   RBC 5.72 (*)    MCV 72.2 (*)    MCH 22.2 (*)    RDW 17.2 (*)    Neutro Abs 7.9 (*)    All other components within normal limits  URINALYSIS, ROUTINE W REFLEX MICROSCOPIC - Abnormal; Notable for the following components:   Color, Urine COLORLESS (*)    Hgb urine dipstick SMALL (*)    All other components within normal limits  BLOOD GAS, VENOUS - Abnormal; Notable for the following components:   pH, Ven 7.447 (*)     pCO2, Ven 37.7 (*)    pO2, Ven 91.0 (*)    Acid-Base Excess 2.2 (*)    All other components within normal limits  BRAIN NATRIURETIC PEPTIDE - Abnormal; Notable for the following components:   B Natriuretic Peptide 173.0 (*)    All other components within normal limits  CULTURE, BLOOD (ROUTINE X 2)  CULTURE, BLOOD (ROUTINE X 2)  HCG, QUANTITATIVE, PREGNANCY  MAGNESIUM  LACTIC ACID, PLASMA   EKG None  Radiology DG Chest 2 View  Result Date: 03/31/2021 CLINICAL DATA:  A 50 year old female presents for evaluation of shortness of breath. EXAM: CHEST - 2 VIEW COMPARISON:  Comparison is made with February 06, 2018. FINDINGS: EKG leads project over the chest. Study limited by patient body habitus and portable technique. Trachea midline. Cardiomediastinal contours and hilar structures are stable. No signs of lobar consolidation. No visible pneumothorax. No sign of pleural effusion. On limited assessment there is no acute skeletal process. IMPRESSION: No acute cardiopulmonary disease. Electronically Signed   By: Donzetta Kohut M.D.   On: 03/31/2021 16:46   CT Angio Chest PE W and/or Wo Contrast  Result Date: 03/31/2021 CLINICAL DATA:  Abdominal pain Sudden onset of increased shortness of breath for 2.5 hours EXAM: CT ANGIOGRAPHY CHEST CT ABDOMEN AND PELVIS WITH CONTRAST TECHNIQUE: Multidetector CT imaging of the chest was performed using the standard protocol during bolus administration of intravenous contrast. Multiplanar CT image reconstructions and MIPs were obtained to evaluate the vascular anatomy. Multidetector CT imaging of the abdomen and pelvis was performed using the standard protocol during bolus administration of intravenous contrast. CONTRAST:  OMNIPAQUE IOHEXOL 350 MG/ML SOLN COMPARISON:  03/31/2021, 06/05/2018 FINDINGS: CTA CHEST FINDINGS Cardiovascular: This is a technically adequate evaluation of the pulmonary vasculature. No filling defects or pulmonary emboli. The heart is  unremarkable without  pericardial effusion. No evidence of thoracic aortic aneurysm or dissection. Mediastinum/Nodes: Right paratracheal adenopathy measures up to 17 mm in short axis, reference image 27/1. Right hilar lymph node measures up to 12 mm in short axis, reference image 42/1. No other pathologic adenopathy within the mediastinum or hila. Thyroid, trachea, and esophagus appear unremarkable. Lungs/Pleura: There are patchy scattered ground-glass opacities throughout the lungs bilaterally, in a pattern that suggests atypical infection such as COVID-19. No effusion or pneumothorax. Central airways are patent. Musculoskeletal: No acute or destructive bony lesions. Reconstructed images demonstrate no additional findings. Review of the MIP images confirms the above findings. CT ABDOMEN and PELVIS FINDINGS Hepatobiliary: No focal liver abnormality is seen. Status post cholecystectomy. No biliary dilatation. Pancreas: Unremarkable. No pancreatic ductal dilatation or surrounding inflammatory changes. Spleen: Normal in size without focal abnormality. Adrenals/Urinary Tract: Fat containing right adrenal mass measuring 5.0 x 5.5 x 5.5 cm consistent with myelolipoma, slightly increased in size since prior study. Left adrenal is stable. The kidneys enhance normally and symmetrically. No urinary tract calculi or obstructive uropathy. Bladder is unremarkable. Stomach/Bowel: No bowel obstruction or ileus. Normal appendix right lower quadrant. No bowel wall thickening or inflammatory change. Vascular/Lymphatic: Aortic atherosclerosis. No enlarged abdominal or pelvic lymph nodes. Reproductive: Postsurgical changes from supracervical hysterectomy. No adnexal masses. Other: No free fluid or free gas.  No abdominal wall hernia. Musculoskeletal: No acute or destructive bony lesions. Reconstructed images demonstrate no additional findings. Review of the MIP images confirms the above findings. IMPRESSION: 1. No evidence of pulmonary  embolus. 2. Patchy bilateral ground-glass airspace disease, most consistent with inflammation or infection. Pattern is typical for COVID-19. 3. Mediastinal and right hilar adenopathy, likely reactive. 4. No acute intra-abdominal or intrapelvic process. 5. 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 No evidence of associated hemorrhage. Nonemergent surgical follow-up recommended. Electronically Signed   By: Sharlet Salina M.D.   On: 03/31/2021 17:55   CT ABDOMEN PELVIS W CONTRAST  Result Date: 03/31/2021 CLINICAL DATA:  Abdominal pain Sudden onset of increased shortness of breath for 2.5 hours EXAM: CT ANGIOGRAPHY CHEST CT ABDOMEN AND PELVIS WITH CONTRAST TECHNIQUE: Multidetector CT imaging of the chest was performed using the standard protocol during bolus administration of intravenous contrast. Multiplanar CT image reconstructions and MIPs were obtained to evaluate the vascular anatomy. Multidetector CT imaging of the abdomen and pelvis was performed using the standard protocol during bolus administration of intravenous contrast. CONTRAST:  OMNIPAQUE IOHEXOL 350 MG/ML SOLN COMPARISON:  03/31/2021, 06/05/2018 FINDINGS: CTA CHEST FINDINGS Cardiovascular: This is a technically adequate evaluation of the pulmonary vasculature. No filling defects or pulmonary emboli. The heart is unremarkable without pericardial effusion. No evidence of thoracic aortic aneurysm or dissection. Mediastinum/Nodes: Right paratracheal adenopathy measures up to 17 mm in short axis, reference image 27/1. Right hilar lymph node measures up to 12 mm in short axis, reference image 42/1. No other pathologic adenopathy within the mediastinum or hila. Thyroid, trachea, and esophagus appear unremarkable. Lungs/Pleura: There are patchy scattered ground-glass opacities throughout the lungs bilaterally, in a pattern that suggests atypical infection such as COVID-19. No effusion or pneumothorax. Central airways are patent.  Musculoskeletal: No acute or destructive bony lesions. Reconstructed images demonstrate no additional findings. Review of the MIP images confirms the above findings. CT ABDOMEN and PELVIS FINDINGS Hepatobiliary: No focal liver abnormality is seen. Status post cholecystectomy. No biliary dilatation. Pancreas: Unremarkable. No pancreatic ductal dilatation or surrounding inflammatory changes. Spleen: Normal in size without focal abnormality. Adrenals/Urinary Tract:  Fat containing right adrenal mass measuring 5.0 x 5.5 x 5.5 cm consistent with myelolipoma, slightly increased in size since prior study. Left adrenal is stable. The kidneys enhance normally and symmetrically. No urinary tract calculi or obstructive uropathy. Bladder is unremarkable. Stomach/Bowel: No bowel obstruction or ileus. Normal appendix right lower quadrant. No bowel wall thickening or inflammatory change. Vascular/Lymphatic: Aortic atherosclerosis. No enlarged abdominal or pelvic lymph nodes. Reproductive: Postsurgical changes from supracervical hysterectomy. No adnexal masses. Other: No free fluid or free gas.  No abdominal wall hernia. Musculoskeletal: No acute or destructive bony lesions. Reconstructed images demonstrate no additional findings. Review of the MIP images confirms the above findings. IMPRESSION: 1. No evidence of pulmonary embolus. 2. Patchy bilateral ground-glass airspace disease, most consistent with inflammation or infection. Pattern is typical for COVID-19. 3. Mediastinal and right hilar adenopathy, likely reactive. 4. No acute intra-abdominal or intrapelvic process. 5. 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 No evidence of associated hemorrhage. Nonemergent surgical follow-up recommended. Electronically Signed   By: Sharlet Salina M.D.   On: 03/31/2021 17:55    Procedures .Critical Care Performed by: Placido Sou, PA-C Authorized by: Placido Sou, PA-C   Critical care provider statement:     Critical care time (minutes):  30   Critical care was necessary to treat or prevent imminent or life-threatening deterioration of the following conditions:  Respiratory failure   Critical care was time spent personally by me on the following activities:  Development of treatment plan with patient or surrogate, discussions with consultants, evaluation of patient's response to treatment, examination of patient, ordering and review of laboratory studies, ordering and review of radiographic studies, ordering and performing treatments and interventions, pulse oximetry, re-evaluation of patient's condition and review of old charts   Medications Ordered in ED Medications  potassium chloride 10 mEq in 100 mL IVPB (10 mEq Intravenous New Bag/Given 03/31/21 1801)  acetaminophen (TYLENOL) tablet 1,000 mg (1,000 mg Oral Given 03/31/21 1745)  magnesium sulfate IVPB 2 g 50 mL (0 g Intravenous Stopped 03/31/21 1851)  potassium chloride SA (KLOR-CON M) CR tablet 40 mEq (40 mEq Oral Given 03/31/21 1746)  iohexol (OMNIPAQUE) 350 MG/ML injection 90 mL (100 mLs Intravenous Contrast Given 03/31/21 1721)  lactated ringers bolus 1,000 mL (1,000 mLs Intravenous New Bag/Given (Non-Interop) 03/31/21 1800)   ED Course  I have reviewed the triage vital signs and the nursing notes.  Pertinent labs & imaging results that were available during my care of the patient were reviewed by me and considered in my medical decision making (see chart for details).  Clinical Course as of 03/31/21 1920  Fri Mar 31, 2021  1628 Oxygen removed and patient desaturated to 88% on room air.  We will continue with 3 L via nasal cannula. [LJ]  1751 Influenza A By PCR(!): POSITIVE [LJ]    Clinical Course User Index [LJ] Placido Sou, PA-C   MDM Rules/Calculators/A&P                          Pt is a 50 y.o. female who presents to the emergency department due to shortness of breath.  Labs: CBC with RBCs of 5.72, MCV of 72.2, MCH  of 22.2, RDW of 17.2, neutrophils 11.9. CMP with a potassium of 2.7, glucose of 140, calcium of 8.1, AST of 42. Lactic acid of 2.6. VBG with a pH of 7.447, PCO2 of 37.7, PO2 of 91. BNP elevated at 173. hCG quantitative is  1. Blood cultures obtained. UA with small hemoglobin. Magnesium within normal limits at 2. Respiratory panel is positive for influenza A.  Imaging: Chest x-ray is negative. CTA of the chest as well as CT of the abdomen/pelvis with contrast shows IMPRESSION: 1. No evidence of pulmonary embolus. 2. Patchy bilateral ground-glass airspace disease, most consistent with inflammation or infection. Pattern is typical for COVID-19. 3. Mediastinal and right hilar adenopathy, likely reactive. 4. No acute intra-abdominal or intrapelvic process. 5. 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 No evidence of associated hemorrhage. Nonemergent surgical follow-up recommended.   I, Placido Sou, PA-C, personally reviewed and evaluated these images and lab results as part of my medical decision-making.  Patient with worsening shortness of breath over the past 24 hours as well as viral URI symptoms.  Upon EMS arrival patient had desaturated into the 70s.  Patient denies a pulmonary history or home oxygen requirement.  Patient found to be positive for influenza A.  CT scan negative for PE but does show patchy bilateral ground glass airspace disease.  Oxygen saturations in the mid 90s on 3 L via nasal cannula.  Patient found to be hypokalemic at 2.7.  Likely worsened due to her recent albuterol and DuoNeb treatments.  Given patient's shortness of breath she was started on IV magnesium.  She was also started on oral potassium as well as IV potassium.    Given patient's age, comorbidities, as well as recurrent hypoxia, feel that she will require admission for further management.  This was discussed with the patient and she is amenable.  We will discuss with the medicine team at  this time.  Note: Portions of this report may have been transcribed using voice recognition software. Every effort was made to ensure accuracy; however, inadvertent computerized transcription errors may be present.   Final Clinical Impression(s) / ED Diagnoses Final diagnoses:  Influenza A  Hypoxia  Hypokalemia   Rx / DC Orders ED Discharge Orders     None        Placido Sou, PA-C 03/31/21 1920    Mancel Bale, MD 04/01/21 [id number]

## 2021-03-31 NOTE — Assessment & Plan Note (Signed)
·   Atypical mild infiltrates on CT imaging along with slightly elevated BNP  No obvious evidence of acute cardiogenic volume overload however considering patient's shortness of breath and hypoxia will obtain echocardiogram

## 2021-03-31 NOTE — Assessment & Plan Note (Signed)
·   Influenza A PCR testing found to be positive  Treating with Tamiflu, supplemental oxygen and bronchodilator therapy  Remainder of assessment and plan as above.

## 2021-03-31 NOTE — Assessment & Plan Note (Signed)
·   Patient exhibiting multiple SIRS criteria with associated organ dysfunction including acute hypoxic respiratory failure and lactic acidosis secondary to underlying influenza A infection all consistent with sepsis  Treating underlying sepsis with Tamiflu and aggressive intravenous volume resuscitation  Blood cultures obtained  Procalcitonin pending  Monitoring closely for clinical improvement

## 2021-03-31 NOTE — Assessment & Plan Note (Addendum)
Continuing home regimen of daily PPI therapy.  

## 2021-03-31 NOTE — Assessment & Plan Note (Signed)
·   Patient suffering from acute hypoxic respiratory failure as evidenced by substantial observed hypoxia with oxygen saturations in the 70s requiring significant amounts of submental oxygen  Thought to be secondary to influenza A infection with viral pneumonia  Submental oxygen to maintain oxygen saturations of 92% and higher.  Treating underlying influenza with Tamiflu  Bronchodilator therapy for associated wheezing and shortness of breath  Weaning supplemental oxygen as tolerated

## 2021-04-01 ENCOUNTER — Inpatient Hospital Stay (HOSPITAL_COMMUNITY): Payer: BC Managed Care – PPO

## 2021-04-01 DIAGNOSIS — I5031 Acute diastolic (congestive) heart failure: Secondary | ICD-10-CM | POA: Diagnosis not present

## 2021-04-01 DIAGNOSIS — J9601 Acute respiratory failure with hypoxia: Secondary | ICD-10-CM | POA: Diagnosis not present

## 2021-04-01 LAB — COMPREHENSIVE METABOLIC PANEL
ALT: 31 U/L (ref 0–44)
AST: 38 U/L (ref 15–41)
Albumin: 3.6 g/dL (ref 3.5–5.0)
Alkaline Phosphatase: 93 U/L (ref 38–126)
Anion gap: 7 (ref 5–15)
BUN: 12 mg/dL (ref 6–20)
CO2: 30 mmol/L (ref 22–32)
Calcium: 8.4 mg/dL — ABNORMAL LOW (ref 8.9–10.3)
Chloride: 104 mmol/L (ref 98–111)
Creatinine, Ser: 0.54 mg/dL (ref 0.44–1.00)
GFR, Estimated: >60 mL/min (ref >60)
Glucose, Bld: 166 mg/dL — ABNORMAL HIGH (ref 70–99)
Potassium: 4.2 mmol/L (ref 3.5–5.1)
Sodium: 141 mmol/L (ref 135–145)
Total Bilirubin: 0.5 mg/dL (ref 0.3–1.2)
Total Protein: 6.8 g/dL (ref 6.5–8.1)

## 2021-04-01 LAB — HEMOGLOBIN A1C
Hgb A1c MFr Bld: 6.3 % — ABNORMAL HIGH (ref 4.8–5.6)
Mean Plasma Glucose: 134.11 mg/dL

## 2021-04-01 LAB — CBC WITH DIFFERENTIAL/PLATELET
Abs Immature Granulocytes: 0.05 10*3/uL (ref 0.00–0.07)
Basophils Absolute: 0 10*3/uL (ref 0.0–0.1)
Basophils Relative: 0 %
Eosinophils Absolute: 0 10*3/uL (ref 0.0–0.5)
Eosinophils Relative: 0 %
HCT: 40.5 % (ref 36.0–46.0)
Hemoglobin: 12.6 g/dL (ref 12.0–15.0)
Immature Granulocytes: 1 %
Lymphocytes Relative: 8 %
Lymphs Abs: 0.7 10*3/uL (ref 0.7–4.0)
MCH: 22.7 pg — ABNORMAL LOW (ref 26.0–34.0)
MCHC: 31.1 g/dL (ref 30.0–36.0)
MCV: 72.8 fL — ABNORMAL LOW (ref 80.0–100.0)
Monocytes Absolute: 0.6 10*3/uL (ref 0.1–1.0)
Monocytes Relative: 7 %
Neutro Abs: 8.1 10*3/uL — ABNORMAL HIGH (ref 1.7–7.7)
Neutrophils Relative %: 84 %
Platelets: 193 10*3/uL (ref 150–400)
RBC: 5.56 MIL/uL — ABNORMAL HIGH (ref 3.87–5.11)
RDW: 16.8 % — ABNORMAL HIGH (ref 11.5–15.5)
WBC: 9.5 10*3/uL (ref 4.0–10.5)
nRBC: 0 % (ref 0.0–0.2)

## 2021-04-01 LAB — CBG MONITORING, ED
Glucose-Capillary: 165 mg/dL — ABNORMAL HIGH (ref 70–99)
Glucose-Capillary: 152 mg/dL — ABNORMAL HIGH (ref 70–99)

## 2021-04-01 LAB — LACTIC ACID, PLASMA
Lactic Acid, Venous: 3 mmol/L (ref 0.5–1.9)
Lactic Acid, Venous: 0.9 mmol/L (ref 0.5–1.9)

## 2021-04-01 LAB — ECHOCARDIOGRAM COMPLETE
Area-P 1/2: 3.23 cm2
S' Lateral: 2.8 cm

## 2021-04-01 LAB — MAGNESIUM: Magnesium: 2.4 mg/dL (ref 1.7–2.4)

## 2021-04-01 LAB — APTT: aPTT: 31 seconds (ref 24–36)

## 2021-04-01 LAB — GLUCOSE, CAPILLARY
Glucose-Capillary: 133 mg/dL — ABNORMAL HIGH (ref 70–99)
Glucose-Capillary: 157 mg/dL — ABNORMAL HIGH (ref 70–99)

## 2021-04-01 LAB — PROTIME-INR
INR: 1.1 (ref 0.8–1.2)
Prothrombin Time: 14 seconds (ref 11.4–15.2)

## 2021-04-01 LAB — HIV ANTIBODY (ROUTINE TESTING W REFLEX): HIV Screen 4th Generation wRfx: NONREACTIVE

## 2021-04-01 LAB — PROCALCITONIN: Procalcitonin: <0.1 ng/mL

## 2021-04-01 MED ORDER — ALBUTEROL SULFATE (2.5 MG/3ML) 0.083% IN NEBU
2.5000 mg | INHALATION_SOLUTION | Freq: Four times a day (QID) | RESPIRATORY_TRACT | Status: DC
  Administered 2021-04-01 – 2021-04-02 (×5): 2.5 mg via RESPIRATORY_TRACT
  Filled 2021-04-01 (×5): qty 3

## 2021-04-01 MED ORDER — POTASSIUM CHLORIDE 2 MEQ/ML IV SOLN
INTRAVENOUS | Status: DC
  Filled 2021-04-01 (×5): qty 1000

## 2021-04-01 MED ORDER — LACTATED RINGERS IV BOLUS
1000.0000 mL | Freq: Once | INTRAVENOUS | Status: AC
  Administered 2021-04-01: 1000 mL via INTRAVENOUS

## 2021-04-01 MED ORDER — PERFLUTREN LIPID MICROSPHERE
1.0000 mL | INTRAVENOUS | Status: AC | PRN
Start: 2021-04-01 — End: 2021-04-01
  Administered 2021-04-01: 2 mL via INTRAVENOUS
  Filled 2021-04-01: qty 10

## 2021-04-01 MED ORDER — GUAIFENESIN-CODEINE 100-10 MG/5ML PO SOLN
10.0000 mL | Freq: Four times a day (QID) | ORAL | Status: DC | PRN
  Administered 2021-04-01 – 2021-04-02 (×4): 10 mL via ORAL
  Filled 2021-04-01 (×4): qty 10

## 2021-04-01 NOTE — Progress Notes (Addendum)
PROGRESS NOTE    Jaime Daugherty  NIO:[id number] DOB: 02-Jun-1970 DOA: 03/31/2021 PCP: [name], MD   Brief Narrative: 50 year old female admitted with shortness of breath and diarrhea.  She has history of hypertension diabetes obesity hyperlipidemia and GERD.  She was found to have influenza A positive.  COVID was negative.  She was febrile at 102.4 with tachypnea and tachycardia lactic acidosis.  CT of the chest showed no evidence of pulmonary embolism but had patchy bilateral groundglass airspace opacities consistent with viral pneumonitis.  She was given IV fluids.  Assessment & Plan:   Principal Problem:   Acute respiratory failure with hypoxia (HCC) Active Problems:   Essential hypertension   Type 2 diabetes mellitus with hyperglycemia, with long-term current use of insulin (HCC)   Influenza A   Acute diarrhea   Hypokalemia due to excessive gastrointestinal loss of potassium   Mixed hyperlipidemia due to type 2 diabetes mellitus (HCC)   GERD without esophagitis   Lactic acidosis   Elevated brain natriuretic peptide (BNP) level   Sepsis, viral (HCC)   #1 acute hypoxic respiratory failure secondary to influenza A pneumonia-patient started on Tamiflu.  Her saturation was 70s on room air on admission. Continue bronchodilator therapy with incentive spirometer.  Titrate O2 as tolerated.. Encourage out of bed ambulate PT consult  #2 sepsis present on admission she met multiple criteria with fever tachypnea tachycardia and lactic acidosis secondary to influenza A.  Continue Tamiflu.  Follow-up blood cultures remain negative.  Lactic acid down to 0.9 from 3.  #3 diarrhea resolving likely secondary to influenza A continue symptomatic treatment  #4 essential hypertension continue home meds blood pressure 136/70  #5 type 2 diabetes continue SSI Hemoglobin A1c 6.3  #6 hyperlipidemia continue statin  #7 GERD continue PPI   Estimated body mass index is 44.1 kg/m as calculated  from the following:   Height as of 06/14/20: $RemoveBef'5\' 5"'jdnVwtRuMP$  (1.651 m).   Weight as of 06/14/20: 120.2 kg.  DVT prophylaxis: Lovenox Code Status: Full code Family Communication: None at bedside  disposition Plan:  Status is: Inpatient  Remains inpatient appropriate because: Acute hypoxia due to influenza Consultants: None  Procedures: None Antimicrobials: None in  Subjective: She is complaining that she is easily short of breath with any movement when I saw her this morning she was sitting up and sleeping with the CPAP on.  She has a CPAP at home.  But she does not have oxygen at home.  She is still on 2 L saturating 96%.  Objective: Vitals:   04/01/21 0700 04/01/21 0900 04/01/21 1208 04/01/21 1311  BP: 133/79 (!) 113/92 138/67   Pulse: 90 90 91   Resp:  19 18   Temp:      TempSrc:      SpO2: 92% 97% 97% 96%    Intake/Output Summary (Last 24 hours) at 04/01/2021 1436 Last data filed at 04/01/2021 0730 Gross per 24 hour  Intake 2040 ml  Output 1680 ml  Net 360 ml   There were no vitals filed for this visit.  Examination:  General exam: Appears in mild distress due to shortness of breath and wheezing Respiratory system: Wheezing to auscultation. Respiratory effort normal. Cardiovascular system: S1 & S2 heard, RRR. No JVD, murmurs, rubs, gallops or clicks. No pedal edema. Gastrointestinal system: Abdomen is nondistended, soft and nontender. No organomegaly or masses felt. Normal bowel sounds heard. Central nervous system: Alert and oriented. No focal neurological deficits. Extremities: Symmetric 5 x 5 power.  Skin: No rashes, lesions or ulcers Psychiatry: Judgement and insight appear normal. Mood & affect appropriate.     Data Reviewed: I have personally reviewed following labs and imaging studies  CBC: Recent Labs  Lab 03/31/21 1611 04/01/21 0451  WBC 9.7 9.5  NEUTROABS 7.9* 8.1*  HGB 12.7 12.6  HCT 41.3 40.5  MCV 72.2* 72.8*  PLT 177 997   Basic Metabolic  Panel: Recent Labs  Lab 03/31/21 1611 03/31/21 1712 04/01/21 0451  NA 138  --  141  K 2.7*  --  4.2  CL 102  --  104  CO2 25  --  30  GLUCOSE 140*  --  166*  BUN 8  --  12  CREATININE 0.73  --  0.54  CALCIUM 8.1*  --  8.4*  MG  --  2.0 2.4   GFR: CrCl cannot be calculated (Unknown ideal weight.). Liver Function Tests: Recent Labs  Lab 03/31/21 1611 04/01/21 0451  AST 42* 38  ALT 33 31  ALKPHOS 97 93  BILITOT 0.7 0.5  PROT 7.1 6.8  ALBUMIN 3.8 3.6   No results for input(s): LIPASE, AMYLASE in the last 168 hours. No results for input(s): AMMONIA in the last 168 hours. Coagulation Profile: Recent Labs  Lab 04/01/21 0451  INR 1.1   Cardiac Enzymes: No results for input(s): CKTOTAL, CKMB, CKMBINDEX, TROPONINI in the last 168 hours. BNP (last 3 results) No results for input(s): PROBNP in the last 8760 hours. HbA1C: Recent Labs    04/01/21 0451  HGBA1C 6.3*   CBG: Recent Labs  Lab 03/31/21 2155 04/01/21 0753 04/01/21 1206  GLUCAP 255* 165* 152*   Lipid Profile: No results for input(s): CHOL, HDL, LDLCALC, TRIG, CHOLHDL, LDLDIRECT in the last 72 hours. Thyroid Function Tests: No results for input(s): TSH, T4TOTAL, FREET4, T3FREE, THYROIDAB in the last 72 hours. Anemia Panel: No results for input(s): VITAMINB12, FOLATE, FERRITIN, TIBC, IRON, RETICCTPCT in the last 72 hours. Sepsis Labs: Recent Labs  Lab 03/31/21 1611 03/31/21 2000 03/31/21 2030 04/01/21 0122 04/01/21 0451  PROCALCITON  --   --  <0.10  --   --   LATICACIDVEN 2.6* 2.7*  --  3.0* 0.9    Recent Results (from the past 240 hour(s))  Resp Panel by RT-PCR (Flu A&B, Covid) Nasopharyngeal Swab     Status: Abnormal   Collection Time: 03/31/21  4:48 PM   Specimen: Nasopharyngeal Swab; Nasopharyngeal(NP) swabs in vial transport medium  Result Value Ref Range Status   SARS Coronavirus 2 by RT PCR NEGATIVE NEGATIVE Final    Comment: (NOTE) SARS-CoV-2 target nucleic acids are NOT  DETECTED.  The SARS-CoV-2 RNA is generally detectable in upper respiratory specimens during the acute phase of infection. The lowest concentration of SARS-CoV-2 viral copies this assay can detect is 138 copies/mL. A negative result does not preclude SARS-Cov-2 infection and should not be used as the sole basis for treatment or other patient management decisions. A negative result may occur with  improper specimen collection/handling, submission of specimen other than nasopharyngeal swab, presence of viral mutation(s) within the areas targeted by this assay, and inadequate number of viral copies(<138 copies/mL). A negative result must be combined with clinical observations, patient history, and epidemiological information. The expected result is Negative.  Fact Sheet for Patients:  EntrepreneurPulse.com.au  Fact Sheet for Healthcare Providers:  IncredibleEmployment.be  This test is no t yet approved or cleared by the [place] FDA and  has been authorized for detection and/or diagnosis of  SARS-CoV-2 by FDA under an Emergency Use Authorization (EUA). This EUA will remain  in effect (meaning this test can be used) for the duration of the COVID-19 declaration under Section 564(b)(1) of the Act, 21 U.S.C.section 360bbb-3(b)(1), unless the authorization is terminated  or revoked sooner.       Influenza A by PCR POSITIVE (A) NEGATIVE Final   Influenza B by PCR NEGATIVE NEGATIVE Final    Comment: (NOTE) The Xpert Xpress SARS-CoV-2/FLU/RSV plus assay is intended as an aid in the diagnosis of influenza from Nasopharyngeal swab specimens and should not be used as a sole basis for treatment. Nasal washings and aspirates are unacceptable for Xpert Xpress SARS-CoV-2/FLU/RSV testing.  Fact Sheet for Patients: EntrepreneurPulse.com.au  Fact Sheet for Healthcare Providers: IncredibleEmployment.be  This test is not  yet approved or cleared by the [place] FDA and has been authorized for detection and/or diagnosis of SARS-CoV-2 by FDA under an Emergency Use Authorization (EUA). This EUA will remain in effect (meaning this test can be used) for the duration of the COVID-19 declaration under Section 564(b)(1) of the Act, 21 U.S.C. section 360bbb-3(b)(1), unless the authorization is terminated or revoked.  Performed at [facility], [place] [address] [place], [place] [address]          Radiology Studies: DG Chest 1 View  Result Date: 04/01/2021 CLINICAL DATA:  Dyspnea EXAM: CHEST  1 VIEW COMPARISON:  03/31/2021 FINDINGS: The heart size and mediastinal contours are within normal limits. Both lungs are clear. The visualized skeletal structures are unremarkable. IMPRESSION: No active disease. Electronically Signed   By: Fidela Salisbury M.D.   On: 04/01/2021 03:53   DG Chest 2 View  Result Date: 03/31/2021 CLINICAL DATA:  A 50 year old female presents for evaluation of shortness of breath. EXAM: CHEST - 2 VIEW COMPARISON:  Comparison is made with February 06, 2018. FINDINGS: EKG leads project over the chest. Study limited by patient body habitus and portable technique. Trachea midline. Cardiomediastinal contours and hilar structures are stable. No signs of lobar consolidation. No visible pneumothorax. No sign of pleural effusion. On limited assessment there is no acute skeletal process. IMPRESSION: No acute cardiopulmonary disease. Electronically Signed   By: Zetta Bills M.D.   On: 03/31/2021 16:46   CT Angio Chest PE W and/or Wo Contrast  Result Date: 03/31/2021 CLINICAL DATA:  Abdominal pain Sudden onset of increased shortness of breath for 2.5 hours EXAM: CT ANGIOGRAPHY CHEST CT ABDOMEN AND PELVIS WITH CONTRAST TECHNIQUE: Multidetector CT imaging of the chest was performed using the standard protocol during bolus administration of intravenous contrast. Multiplanar CT image  reconstructions and MIPs were obtained to evaluate the vascular anatomy. Multidetector CT imaging of the abdomen and pelvis was performed using the standard protocol during bolus administration of intravenous contrast. CONTRAST:  135mL OMNIPAQUE IOHEXOL 350 MG/ML SOLN COMPARISON:  03/31/2021, 06/05/2018 FINDINGS: CTA CHEST FINDINGS Cardiovascular: This is a technically adequate evaluation of the pulmonary vasculature. No filling defects or pulmonary emboli. The heart is unremarkable without pericardial effusion. No evidence of thoracic aortic aneurysm or dissection. Mediastinum/Nodes: Right paratracheal adenopathy measures up to 17 mm in short axis, reference image 27/1. Right hilar lymph node measures up to 12 mm in short axis, reference image 42/1. No other pathologic adenopathy within the mediastinum or hila. Thyroid, trachea, and esophagus appear unremarkable. Lungs/Pleura: There are patchy scattered ground-glass opacities throughout the lungs bilaterally, in a pattern that suggests atypical infection such as COVID-19. No effusion or pneumothorax. Central airways are patent.  Musculoskeletal: No acute or destructive bony lesions. Reconstructed images demonstrate no additional findings. Review of the MIP images confirms the above findings. CT ABDOMEN and PELVIS FINDINGS Hepatobiliary: No focal liver abnormality is seen. Status post cholecystectomy. No biliary dilatation. Pancreas: Unremarkable. No pancreatic ductal dilatation or surrounding inflammatory changes. Spleen: Normal in size without focal abnormality. Adrenals/Urinary Tract: Fat containing right adrenal mass measuring 5.0 x 5.5 x 5.5 cm consistent with myelolipoma, slightly increased in size since prior study. Left adrenal is stable. The kidneys enhance normally and symmetrically. No urinary tract calculi or obstructive uropathy. Bladder is unremarkable. Stomach/Bowel: No bowel obstruction or ileus. Normal appendix right lower quadrant. No bowel wall  thickening or inflammatory change. Vascular/Lymphatic: Aortic atherosclerosis. No enlarged abdominal or pelvic lymph nodes. Reproductive: Postsurgical changes from supracervical hysterectomy. No adnexal masses. Other: No free fluid or free gas.  No abdominal wall hernia. Musculoskeletal: No acute or destructive bony lesions. Reconstructed images demonstrate no additional findings. Review of the MIP images confirms the above findings. IMPRESSION: 1. No evidence of pulmonary embolus. 2. Patchy bilateral ground-glass airspace disease, most consistent with inflammation or infection. Pattern is typical for COVID-19. 3. Mediastinal and right hilar adenopathy, likely reactive. 4. No acute intra-abdominal or intrapelvic process. 5. 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 No evidence of associated hemorrhage. Nonemergent surgical follow-up recommended. Electronically Signed   By: Randa Ngo M.D.   On: 03/31/2021 17:55   CT ABDOMEN PELVIS W CONTRAST  Result Date: 03/31/2021 CLINICAL DATA:  Abdominal pain Sudden onset of increased shortness of breath for 2.5 hours EXAM: CT ANGIOGRAPHY CHEST CT ABDOMEN AND PELVIS WITH CONTRAST TECHNIQUE: Multidetector CT imaging of the chest was performed using the standard protocol during bolus administration of intravenous contrast. Multiplanar CT image reconstructions and MIPs were obtained to evaluate the vascular anatomy. Multidetector CT imaging of the abdomen and pelvis was performed using the standard protocol during bolus administration of intravenous contrast. CONTRAST:  1109mL OMNIPAQUE IOHEXOL 350 MG/ML SOLN COMPARISON:  03/31/2021, 06/05/2018 FINDINGS: CTA CHEST FINDINGS Cardiovascular: This is a technically adequate evaluation of the pulmonary vasculature. No filling defects or pulmonary emboli. The heart is unremarkable without pericardial effusion. No evidence of thoracic aortic aneurysm or dissection. Mediastinum/Nodes: Right paratracheal  adenopathy measures up to 17 mm in short axis, reference image 27/1. Right hilar lymph node measures up to 12 mm in short axis, reference image 42/1. No other pathologic adenopathy within the mediastinum or hila. Thyroid, trachea, and esophagus appear unremarkable. Lungs/Pleura: There are patchy scattered ground-glass opacities throughout the lungs bilaterally, in a pattern that suggests atypical infection such as COVID-19. No effusion or pneumothorax. Central airways are patent. Musculoskeletal: No acute or destructive bony lesions. Reconstructed images demonstrate no additional findings. Review of the MIP images confirms the above findings. CT ABDOMEN and PELVIS FINDINGS Hepatobiliary: No focal liver abnormality is seen. Status post cholecystectomy. No biliary dilatation. Pancreas: Unremarkable. No pancreatic ductal dilatation or surrounding inflammatory changes. Spleen: Normal in size without focal abnormality. Adrenals/Urinary Tract: Fat containing right adrenal mass measuring 5.0 x 5.5 x 5.5 cm consistent with myelolipoma, slightly increased in size since prior study. Left adrenal is stable. The kidneys enhance normally and symmetrically. No urinary tract calculi or obstructive uropathy. Bladder is unremarkable. Stomach/Bowel: No bowel obstruction or ileus. Normal appendix right lower quadrant. No bowel wall thickening or inflammatory change. Vascular/Lymphatic: Aortic atherosclerosis. No enlarged abdominal or pelvic lymph nodes. Reproductive: Postsurgical changes from supracervical hysterectomy. No adnexal masses. Other: No free fluid or  free gas.  No abdominal wall hernia. Musculoskeletal: No acute or destructive bony lesions. Reconstructed images demonstrate no additional findings. Review of the MIP images confirms the above findings. IMPRESSION: 1. No evidence of pulmonary embolus. 2. Patchy bilateral ground-glass airspace disease, most consistent with inflammation or infection. Pattern is typical for  COVID-19. 3. Mediastinal and right hilar adenopathy, likely reactive. 4. No acute intra-abdominal or intrapelvic process. 5. 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 No evidence of associated hemorrhage. Nonemergent surgical follow-up recommended. Electronically Signed   By: Randa Ngo M.D.   On: 03/31/2021 17:55        Scheduled Meds:  albuterol  2.5 mg Nebulization Q6H   enoxaparin (LOVENOX) injection  40 mg Subcutaneous Q24H   insulin aspart  0-15 Units Subcutaneous TID AC & HS   insulin glargine-yfgn  60 Units Subcutaneous Daily   oseltamivir  75 mg Oral BID   pantoprazole  40 mg Oral Daily   rosuvastatin  20 mg Oral QHS   Continuous Infusions:  lactated ringers with kcl 100 mL/hr at 04/01/21 1048   lactated ringers Stopped (04/01/21 0300)   lactated ringers with KCl 20 mEq/L 100 mL/hr at 03/31/21 2201     LOS: 1 day    Time spent: 37 min  Georgette Shell, MD  04/01/2021, 2:36 PM

## 2021-04-01 NOTE — Progress Notes (Signed)
Pt has arrived to Room 1501 from ED. Alert and oriented. Mother at bedside.

## 2021-04-01 NOTE — Progress Notes (Signed)
PROGRESS NOTE    Jaime Daugherty  LZJ:[id number] DOB: 08/15/1970 DOA: 03/31/2021 PCP: [name], MD   Brief Narrative: 50 year old female admitted with shortness of breath and diarrhea.  She has history of hypertension diabetes obesity hyperlipidemia and GERD.  She was found to have influenza A positive.  COVID was negative.  She was febrile at 102.4 with tachypnea and tachycardia lactic acidosis.  CT of the chest showed no evidence of pulmonary embolism but had patchy bilateral groundglass airspace opacities consistent with viral pneumonitis.  She was given IV fluids.  Assessment & Plan:   Principal Problem:   Acute respiratory failure with hypoxia (HCC) Active Problems:   Essential hypertension   Type 2 diabetes mellitus with hyperglycemia, with long-term current use of insulin (HCC)   Influenza A   Acute diarrhea   Hypokalemia due to excessive gastrointestinal loss of potassium   Mixed hyperlipidemia due to type 2 diabetes mellitus (HCC)   GERD without esophagitis   Lactic acidosis   Elevated brain natriuretic peptide (BNP) level   Sepsis, viral (HCC)   #1 acute hypoxic respiratory failure secondary to influenza A pneumonia-patient started on Tamiflu.  Her saturation was 70s on room air on admission. We will start her on bronchodilator therapy. Incentive spirometer Titrate O2 as needed. Currently saturating 96% on 2 L.  #2 sepsis present on admission she met multiple criteria with fever tachypnea tachycardia and lactic acidosis secondary to influenza A.  Continue Tamiflu.  Follow-up blood cultures. Trend lactic acid. Lactic acidosis resolved. Procalcitonin is low. Will not start antibiotics since sepsis is thought to be due to viral infection continue Tamiflu and IV fluids and supportive measures.  #3 diarrhea likely secondary to influenza A continue symptomatic treatment  #4 essential hypertension blood pressure 138/67 continue home meds  #5 type 2 diabetes continue  SSI Hemoglobin A1c 6.3  #6 hyperlipidemia continue statin  #7 GERD continue PPI   Estimated body mass index is 44.1 kg/m as calculated from the following:   Height as of 06/14/20: $RemoveBef'5\' 5"'[id number]$  (1.651 m).   Weight as of 06/14/20: 120.2 kg.  DVT prophylaxis: Lovenox Code Status: Full code Family Communication: Discussed with mother in the room Disposition Plan:  Status is: Inpatient  Remains inpatient appropriate because: Hypoxic new onset   Consultants:  None  Procedures: None Antimicrobials: Tamiflu  Subjective:  Patient is resting in bed requesting a regular diet instead of carb modified diet.  She is requesting a breathing treatment.  She complains of shortness of breath and wheezing. She is saturating 96% on 2 L.  Objective: Vitals:   04/01/21 0700 04/01/21 0900 04/01/21 1208 04/01/21 1311  BP: 133/79 (!) 113/92 138/67   Pulse: 90 90 91   Resp:  19 18   Temp:      TempSrc:      SpO2: 92% 97% 97% 96%    Intake/Output Summary (Last 24 hours) at 04/01/2021 1505 Last data filed at 04/01/2021 0730 Gross per 24 hour  Intake 2040 ml  Output 1680 ml  Net 360 ml    There were no vitals filed for this visit.  Examination:  General exam: Appears calm and comfortable  Respiratory system: Clear to auscultation. Respiratory effort normal. Cardiovascular system: S1 & S2 heard, RRR. No JVD, murmurs, rubs, gallops or clicks. No pedal edema. Gastrointestinal system: Abdomen is nondistended, soft and nontender. No organomegaly or masses felt. Normal bowel sounds heard. Central nervous system: Alert and oriented. No focal neurological deficits. Extremities: Symmetric 5 x  5 power. Skin: No rashes, lesions or ulcers Psychiatry: Judgement and insight appear normal. Mood & affect appropriate.     Data Reviewed: I have personally reviewed following labs and imaging studies  CBC: Recent Labs  Lab 03/31/21 1611 04/01/21 0451  WBC 9.7 9.5  NEUTROABS 7.9* 8.1*  HGB 12.7 12.6   HCT 41.3 40.5  MCV 72.2* 72.8*  PLT 177 824    Basic Metabolic Panel: Recent Labs  Lab 03/31/21 1611 03/31/21 1712 04/01/21 0451  NA 138  --  141  K 2.7*  --  4.2  CL 102  --  104  CO2 25  --  30  GLUCOSE 140*  --  166*  BUN 8  --  12  CREATININE 0.73  --  0.54  CALCIUM 8.1*  --  8.4*  MG  --  2.0 2.4    GFR: CrCl cannot be calculated (Unknown ideal weight.). Liver Function Tests: Recent Labs  Lab 03/31/21 1611 04/01/21 0451  AST 42* 38  ALT 33 31  ALKPHOS 97 93  BILITOT 0.7 0.5  PROT 7.1 6.8  ALBUMIN 3.8 3.6    No results for input(s): LIPASE, AMYLASE in the last 168 hours. No results for input(s): AMMONIA in the last 168 hours. Coagulation Profile: Recent Labs  Lab 04/01/21 0451  INR 1.1    Cardiac Enzymes: No results for input(s): CKTOTAL, CKMB, CKMBINDEX, TROPONINI in the last 168 hours. BNP (last 3 results) No results for input(s): PROBNP in the last 8760 hours. HbA1C: Recent Labs    04/01/21 0451  HGBA1C 6.3*    CBG: Recent Labs  Lab 03/31/21 2155 04/01/21 0753 04/01/21 1206  GLUCAP 255* 165* 152*    Lipid Profile: No results for input(s): CHOL, HDL, LDLCALC, TRIG, CHOLHDL, LDLDIRECT in the last 72 hours. Thyroid Function Tests: No results for input(s): TSH, T4TOTAL, FREET4, T3FREE, THYROIDAB in the last 72 hours. Anemia Panel: No results for input(s): VITAMINB12, FOLATE, FERRITIN, TIBC, IRON, RETICCTPCT in the last 72 hours. Sepsis Labs: Recent Labs  Lab 03/31/21 1611 03/31/21 2000 03/31/21 2030 04/01/21 0122 04/01/21 0451  PROCALCITON  --   --  <0.10  --   --   LATICACIDVEN 2.6* 2.7*  --  3.0* 0.9     Recent Results (from the past 240 hour(s))  Resp Panel by RT-PCR (Flu A&B, Covid) Nasopharyngeal Swab     Status: Abnormal   Collection Time: 03/31/21  4:48 PM   Specimen: Nasopharyngeal Swab; Nasopharyngeal(NP) swabs in vial transport medium  Result Value Ref Range Status   SARS Coronavirus 2 by RT PCR NEGATIVE NEGATIVE  Final    Comment: (NOTE) SARS-CoV-2 target nucleic acids are NOT DETECTED.  The SARS-CoV-2 RNA is generally detectable in upper respiratory specimens during the acute phase of infection. The lowest concentration of SARS-CoV-2 viral copies this assay can detect is 138 copies/mL. A negative result does not preclude SARS-Cov-2 infection and should not be used as the sole basis for treatment or other patient management decisions. A negative result may occur with  improper specimen collection/handling, submission of specimen other than nasopharyngeal swab, presence of viral mutation(s) within the areas targeted by this assay, and inadequate number of viral copies(<138 copies/mL). A negative result must be combined with clinical observations, patient history, and epidemiological information. The expected result is Negative.  Fact Sheet for Patients:  EntrepreneurPulse.com.au  Fact Sheet for Healthcare Providers:  IncredibleEmployment.be  This test is no t yet approved or cleared by the [place] and  has been authorized for detection and/or diagnosis of SARS-CoV-2 by FDA under an Emergency Use Authorization (EUA). This EUA will remain  in effect (meaning this test can be used) for the duration of the COVID-19 declaration under Section 564(b)(1) of the Act, 21 U.S.C.section 360bbb-3(b)(1), unless the authorization is terminated  or revoked sooner.       Influenza A by PCR POSITIVE (A) NEGATIVE Final   Influenza B by PCR NEGATIVE NEGATIVE Final    Comment: (NOTE) The Xpert Xpress SARS-CoV-2/FLU/RSV plus assay is intended as an aid in the diagnosis of influenza from Nasopharyngeal swab specimens and should not be used as a sole basis for treatment. Nasal washings and aspirates are unacceptable for Xpert Xpress SARS-CoV-2/FLU/RSV testing.  Fact Sheet for Patients: EntrepreneurPulse.com.au  Fact Sheet for Healthcare  Providers: IncredibleEmployment.be  This test is not yet approved or cleared by the [place] FDA and has been authorized for detection and/or diagnosis of SARS-CoV-2 by FDA under an Emergency Use Authorization (EUA). This EUA will remain in effect (meaning this test can be used) for the duration of the COVID-19 declaration under Section 564(b)(1) of the Act, 21 U.S.C. section 360bbb-3(b)(1), unless the authorization is terminated or revoked.  Performed at [facility], [place] [address], [place], [place] [address]           Radiology Studies: DG Chest 1 View  Result Date: 04/01/2021 CLINICAL DATA:  Dyspnea EXAM: CHEST  1 VIEW COMPARISON:  03/31/2021 FINDINGS: The heart size and mediastinal contours are within normal limits. Both lungs are clear. The visualized skeletal structures are unremarkable. IMPRESSION: No active disease. Electronically Signed   By: Fidela Salisbury M.D.   On: 04/01/2021 03:53   DG Chest 2 View  Result Date: 03/31/2021 CLINICAL DATA:  A 50 year old female presents for evaluation of shortness of breath. EXAM: CHEST - 2 VIEW COMPARISON:  Comparison is made with February 06, 2018. FINDINGS: EKG leads project over the chest. Study limited by patient body habitus and portable technique. Trachea midline. Cardiomediastinal contours and hilar structures are stable. No signs of lobar consolidation. No visible pneumothorax. No sign of pleural effusion. On limited assessment there is no acute skeletal process. IMPRESSION: No acute cardiopulmonary disease. Electronically Signed   By: Zetta Bills M.D.   On: 03/31/2021 16:46   CT Angio Chest PE W and/or Wo Contrast  Result Date: 03/31/2021 CLINICAL DATA:  Abdominal pain Sudden onset of increased shortness of breath for 2.5 hours EXAM: CT ANGIOGRAPHY CHEST CT ABDOMEN AND PELVIS WITH CONTRAST TECHNIQUE: Multidetector CT imaging of the chest was performed using the standard protocol  during bolus administration of intravenous contrast. Multiplanar CT image reconstructions and MIPs were obtained to evaluate the vascular anatomy. Multidetector CT imaging of the abdomen and pelvis was performed using the standard protocol during bolus administration of intravenous contrast. CONTRAST:  172mL OMNIPAQUE IOHEXOL 350 MG/ML SOLN COMPARISON:  03/31/2021, 06/05/2018 FINDINGS: CTA CHEST FINDINGS Cardiovascular: This is a technically adequate evaluation of the pulmonary vasculature. No filling defects or pulmonary emboli. The heart is unremarkable without pericardial effusion. No evidence of thoracic aortic aneurysm or dissection. Mediastinum/Nodes: Right paratracheal adenopathy measures up to 17 mm in short axis, reference image 27/1. Right hilar lymph node measures up to 12 mm in short axis, reference image 42/1. No other pathologic adenopathy within the mediastinum or hila. Thyroid, trachea, and esophagus appear unremarkable. Lungs/Pleura: There are patchy scattered ground-glass opacities throughout the lungs bilaterally, in a pattern that suggests atypical infection such as  COVID-19. No effusion or pneumothorax. Central airways are patent. Musculoskeletal: No acute or destructive bony lesions. Reconstructed images demonstrate no additional findings. Review of the MIP images confirms the above findings. CT ABDOMEN and PELVIS FINDINGS Hepatobiliary: No focal liver abnormality is seen. Status post cholecystectomy. No biliary dilatation. Pancreas: Unremarkable. No pancreatic ductal dilatation or surrounding inflammatory changes. Spleen: Normal in size without focal abnormality. Adrenals/Urinary Tract: Fat containing right adrenal mass measuring 5.0 x 5.5 x 5.5 cm consistent with myelolipoma, slightly increased in size since prior study. Left adrenal is stable. The kidneys enhance normally and symmetrically. No urinary tract calculi or obstructive uropathy. Bladder is unremarkable. Stomach/Bowel: No bowel  obstruction or ileus. Normal appendix right lower quadrant. No bowel wall thickening or inflammatory change. Vascular/Lymphatic: Aortic atherosclerosis. No enlarged abdominal or pelvic lymph nodes. Reproductive: Postsurgical changes from supracervical hysterectomy. No adnexal masses. Other: No free fluid or free gas.  No abdominal wall hernia. Musculoskeletal: No acute or destructive bony lesions. Reconstructed images demonstrate no additional findings. Review of the MIP images confirms the above findings. IMPRESSION: 1. No evidence of pulmonary embolus. 2. Patchy bilateral ground-glass airspace disease, most consistent with inflammation or infection. Pattern is typical for COVID-19. 3. Mediastinal and right hilar adenopathy, likely reactive. 4. No acute intra-abdominal or intrapelvic process. 5. 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 No evidence of associated hemorrhage. Nonemergent surgical follow-up recommended. Electronically Signed   By: Randa Ngo M.D.   On: 03/31/2021 17:55   CT ABDOMEN PELVIS W CONTRAST  Result Date: 03/31/2021 CLINICAL DATA:  Abdominal pain Sudden onset of increased shortness of breath for 2.5 hours EXAM: CT ANGIOGRAPHY CHEST CT ABDOMEN AND PELVIS WITH CONTRAST TECHNIQUE: Multidetector CT imaging of the chest was performed using the standard protocol during bolus administration of intravenous contrast. Multiplanar CT image reconstructions and MIPs were obtained to evaluate the vascular anatomy. Multidetector CT imaging of the abdomen and pelvis was performed using the standard protocol during bolus administration of intravenous contrast. CONTRAST:  159mL OMNIPAQUE IOHEXOL 350 MG/ML SOLN COMPARISON:  03/31/2021, 06/05/2018 FINDINGS: CTA CHEST FINDINGS Cardiovascular: This is a technically adequate evaluation of the pulmonary vasculature. No filling defects or pulmonary emboli. The heart is unremarkable without pericardial effusion. No evidence of thoracic  aortic aneurysm or dissection. Mediastinum/Nodes: Right paratracheal adenopathy measures up to 17 mm in short axis, reference image 27/1. Right hilar lymph node measures up to 12 mm in short axis, reference image 42/1. No other pathologic adenopathy within the mediastinum or hila. Thyroid, trachea, and esophagus appear unremarkable. Lungs/Pleura: There are patchy scattered ground-glass opacities throughout the lungs bilaterally, in a pattern that suggests atypical infection such as COVID-19. No effusion or pneumothorax. Central airways are patent. Musculoskeletal: No acute or destructive bony lesions. Reconstructed images demonstrate no additional findings. Review of the MIP images confirms the above findings. CT ABDOMEN and PELVIS FINDINGS Hepatobiliary: No focal liver abnormality is seen. Status post cholecystectomy. No biliary dilatation. Pancreas: Unremarkable. No pancreatic ductal dilatation or surrounding inflammatory changes. Spleen: Normal in size without focal abnormality. Adrenals/Urinary Tract: Fat containing right adrenal mass measuring 5.0 x 5.5 x 5.5 cm consistent with myelolipoma, slightly increased in size since prior study. Left adrenal is stable. The kidneys enhance normally and symmetrically. No urinary tract calculi or obstructive uropathy. Bladder is unremarkable. Stomach/Bowel: No bowel obstruction or ileus. Normal appendix right lower quadrant. No bowel wall thickening or inflammatory change. Vascular/Lymphatic: Aortic atherosclerosis. No enlarged abdominal or pelvic lymph nodes. Reproductive: Postsurgical changes from supracervical  hysterectomy. No adnexal masses. Other: No free fluid or free gas.  No abdominal wall hernia. Musculoskeletal: No acute or destructive bony lesions. Reconstructed images demonstrate no additional findings. Review of the MIP images confirms the above findings. IMPRESSION: 1. No evidence of pulmonary embolus. 2. Patchy bilateral ground-glass airspace disease, most  consistent with inflammation or infection. Pattern is typical for COVID-19. 3. Mediastinal and right hilar adenopathy, likely reactive. 4. No acute intra-abdominal or intrapelvic process. 5. 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 No evidence of associated hemorrhage. Nonemergent surgical follow-up recommended. Electronically Signed   By: Randa Ngo M.D.   On: 03/31/2021 17:55   ECHOCARDIOGRAM COMPLETE  Result Date: 04/01/2021    ECHOCARDIOGRAM REPORT   Patient Name:   Jaime Daugherty Date of Exam: 04/01/2021 Medical Rec #:  [id number]        Height:       65.0 in Accession #:    [id number]       Weight:       265.0 lb Date of Birth:  September 01, 1970        BSA:          2.229 m Patient Age:    89 years         BP:           142/126 mmHg Patient Gender: F                HR:           92 bpm. Exam Location:  Inpatient Procedure: 2D Echo, Cardiac Doppler, Color Doppler and Intracardiac            Opacification Agent Indications:    CHF-Acute Diastolic E33.29  History:        Patient has no prior history of Echocardiogram examinations.                 Risk Factors:Hypertension, Diabetes and Dyslipidemia.  Sonographer:    Darlina Sicilian RDCS Referring Phys: [id number] [place]  Sonographer Comments: Technically difficult study due to poor echo windows. IMPRESSIONS  1. Left ventricular ejection fraction, by estimation, is 65 to 70%. The left ventricle has normal function. The left ventricle has no regional wall motion abnormalities. There is mild left ventricular hypertrophy. Left ventricular diastolic parameters are consistent with Grade I diastolic dysfunction (impaired relaxation).  2. Right ventricular systolic function is normal. The right ventricular size is normal.  3. The mitral valve was not well visualized. No evidence of mitral valve regurgitation.  4. The aortic valve is tricuspid. Aortic valve regurgitation is not visualized.  5. The inferior vena cava is dilated in size  with <50% respiratory variability, suggesting right atrial pressure of 15 mmHg. Comparison(s): No prior Echocardiogram. FINDINGS  Left Ventricle: Left ventricular ejection fraction, by estimation, is 65 to 70%. The left ventricle has normal function. The left ventricle has no regional wall motion abnormalities. Definity contrast agent was given IV to delineate the left ventricular  endocardial borders. The left ventricular internal cavity size was normal in size. There is mild left ventricular hypertrophy. Left ventricular diastolic parameters are consistent with Grade I diastolic dysfunction (impaired relaxation). Indeterminate filling pressures. Right Ventricle: The right ventricular size is normal. No increase in right ventricular wall thickness. Right ventricular systolic function is normal. Left Atrium: Left atrial size was normal in size. Right Atrium: Right atrial size was normal in size. Pericardium: There is no evidence of pericardial effusion. Mitral Valve: The mitral  valve was not well visualized. No evidence of mitral valve regurgitation. Tricuspid Valve: The tricuspid valve is grossly normal. Tricuspid valve regurgitation is trivial. Aortic Valve: The aortic valve is tricuspid. Aortic valve regurgitation is not visualized. Pulmonic Valve: The pulmonic valve was grossly normal. Pulmonic valve regurgitation is trivial. Aorta: The aortic root and ascending aorta are structurally normal, with no evidence of dilitation. Venous: The inferior vena cava is dilated in size with less than 50% respiratory variability, suggesting right atrial pressure of 15 mmHg. IAS/Shunts: No atrial level shunt detected by color flow Doppler.  LEFT VENTRICLE PLAX 2D LVIDd:         4.30 cm   Diastology LVIDs:         2.80 cm   LV e' medial:    5.98 cm/s LV PW:         1.20 cm   LV E/e' medial:  12.5 LV IVS:        1.40 cm   LV e' lateral:   7.40 cm/s LVOT diam:     1.50 cm   LV E/e' lateral: 10.1 LVOT Area:     1.77 cm  RIGHT  VENTRICLE RV S prime:     14.90 cm/s TAPSE (M-mode): 1.8 cm LEFT ATRIUM           Index        RIGHT ATRIUM           Index LA diam:      3.60 cm 1.61 cm/m   RA Area:     13.50 cm LA Vol (A2C): 43.6 ml 19.56 ml/m  RA Volume:   32.10 ml  14.40 ml/m   AORTA Ao Root diam: 2.20 cm Ao Asc diam:  2.70 cm MITRAL VALVE MV Area (PHT): 3.23 cm    SHUNTS MV Decel Time: 235 msec    Systemic Diam: 1.50 cm MV E velocity: 74.60 cm/s MV A velocity: 67.30 cm/s MV E/A ratio:  1.11 Lyman Bishop MD Electronically signed by Lyman Bishop MD Signature Date/Time: 04/01/2021/2:37:53 PM    Final         Scheduled Meds:  albuterol  2.5 mg Nebulization Q6H   enoxaparin (LOVENOX) injection  40 mg Subcutaneous Q24H   insulin aspart  0-15 Units Subcutaneous TID AC & HS   insulin glargine-yfgn  60 Units Subcutaneous Daily   oseltamivir  75 mg Oral BID   pantoprazole  40 mg Oral Daily   rosuvastatin  20 mg Oral QHS   Continuous Infusions:  lactated ringers with kcl 100 mL/hr at 04/01/21 1048   lactated ringers Stopped (04/01/21 0300)   lactated ringers with KCl 20 mEq/L 100 mL/hr at 03/31/21 2201     LOS: 1 day    Time spent: 38 min  Georgette Shell, MD  04/01/2021, 3:05 PM

## 2021-04-01 NOTE — Sepsis Progress Note (Signed)
Notified bedside nurse of need to draw repeat lactic acid. Third lactic has been collected and sent to lab now.

## 2021-04-02 DIAGNOSIS — J9601 Acute respiratory failure with hypoxia: Secondary | ICD-10-CM | POA: Diagnosis not present

## 2021-04-02 LAB — GLUCOSE, CAPILLARY
Glucose-Capillary: 95 mg/dL (ref 70–99)
Glucose-Capillary: 200 mg/dL — ABNORMAL HIGH (ref 70–99)
Glucose-Capillary: 166 mg/dL — ABNORMAL HIGH (ref 70–99)
Glucose-Capillary: 296 mg/dL — ABNORMAL HIGH (ref 70–99)
Glucose-Capillary: 142 mg/dL — ABNORMAL HIGH (ref 70–99)

## 2021-04-02 MED ORDER — ALBUTEROL SULFATE (2.5 MG/3ML) 0.083% IN NEBU
2.5000 mg | INHALATION_SOLUTION | Freq: Three times a day (TID) | RESPIRATORY_TRACT | Status: DC
  Administered 2021-04-02 – 2021-04-03 (×4): 2.5 mg via RESPIRATORY_TRACT
  Filled 2021-04-02 (×4): qty 3

## 2021-04-02 MED ORDER — ORAL CARE MOUTH RINSE
15.0000 mL | Freq: Two times a day (BID) | OROMUCOSAL | Status: DC
  Administered 2021-04-02 – 2021-04-03 (×2): 15 mL via OROMUCOSAL

## 2021-04-02 NOTE — Plan of Care (Addendum)
No acute events overnight. Problem: Activity: Goal: Ability to tolerate increased activity will improve Outcome: Progressing   Problem: Clinical Measurements: Goal: Ability to maintain a body temperature in the normal range will improve Outcome: Progressing   Problem: Respiratory: Goal: Ability to maintain adequate ventilation will improve Outcome: Progressing Goal: Ability to maintain a clear airway will improve Outcome: Progressing   Problem: Education: Goal: Knowledge of General Education information will improve Description: Including pain rating scale, medication(s)/side effects and non-pharmacologic comfort measures Outcome: Progressing   Problem: Health Behavior/Discharge Planning: Goal: Ability to manage health-related needs will improve Outcome: Progressing   Problem: Clinical Measurements: Goal: Ability to maintain clinical measurements within normal limits will improve Outcome: Progressing Goal: Will remain free from infection Outcome: Progressing Goal: Diagnostic test results will improve Outcome: Progressing Goal: Respiratory complications will improve Outcome: Progressing Goal: Cardiovascular complication will be avoided Outcome: Progressing   Problem: Activity: Goal: Risk for activity intolerance will decrease Outcome: Progressing   Problem: Nutrition: Goal: Adequate nutrition will be maintained Outcome: Progressing   Problem: Coping: Goal: Level of anxiety will decrease Outcome: Progressing   Problem: Elimination: Goal: Will not experience complications related to bowel motility Outcome: Progressing Goal: Will not experience complications related to urinary retention Outcome: Progressing   Problem: Pain Managment: Goal: General experience of comfort will improve Outcome: Progressing   Problem: Safety: Goal: Ability to remain free from injury will improve Outcome: Progressing   Problem: Skin Integrity: Goal: Risk for impaired skin  integrity will decrease Outcome: Progressing   

## 2021-04-03 DIAGNOSIS — J9601 Acute respiratory failure with hypoxia: Secondary | ICD-10-CM | POA: Diagnosis not present

## 2021-04-03 LAB — GLUCOSE, CAPILLARY
Glucose-Capillary: 88 mg/dL (ref 70–99)
Glucose-Capillary: 117 mg/dL — ABNORMAL HIGH (ref 70–99)
Glucose-Capillary: 165 mg/dL — ABNORMAL HIGH (ref 70–99)

## 2021-04-03 MED ORDER — BENZONATATE 100 MG PO CAPS: 100.0000 mg | ORAL_CAPSULE | Freq: Four times a day (QID) | ORAL | 0 refills | Status: AC | PRN

## 2021-04-03 MED ORDER — OSELTAMIVIR PHOSPHATE 75 MG PO CAPS: 75.0000 mg | ORAL_CAPSULE | Freq: Two times a day (BID) | ORAL | 0 refills | Status: DC

## 2021-04-03 MED ORDER — PREDNISONE 10 MG PO TABS: ORAL_TABLET | ORAL | 0 refills | Status: DC

## 2021-04-03 NOTE — TOC Initial Note (Signed)
Transition of Care [facility]) - Initial/Assessment Note    Patient Details  Name: Jaime Daugherty MRN: [id number] Date of Birth: 12/31/1970  Transition of Care [facility]) CM/SW Contact:    Ida Rogue, LCSW Phone Number: 04/03/2021, 3:48 PM  Clinical Narrative:   Patient who is stable for d/c is in need of home O2.  Spoke with Danielle with [facility] [phone], who will arrange for delivery of home unit, travel cannister.  No further needs identified. TOC sign off.               Expected Discharge Plan: Home/Self Care Barriers to Discharge: No Barriers Identified   Patient Goals and CMS Choice        Expected Discharge Plan and Services Expected Discharge Plan: Home/Self Care         Expected Discharge Date: 04/03/21                                    Prior Living Arrangements/Services                       Activities of Daily Living Home Assistive Devices/Equipment: None ADL Screening (condition at time of admission) Patient's cognitive ability adequate to safely complete daily activities?: Yes Is the patient deaf or have difficulty hearing?: No Does the patient have difficulty seeing, even when wearing glasses/contacts?: No Does the patient have difficulty concentrating, remembering, or making decisions?: No Patient able to express need for assistance with ADLs?: Yes Does the patient have difficulty dressing or bathing?: No Independently performs ADLs?: Yes (appropriate for developmental age) Does the patient have difficulty walking or climbing stairs?: Yes (with Flu and PNA) Weakness of Legs: Both Weakness of Arms/Hands: None  Permission Sought/Granted                  Emotional Assessment              Admission diagnosis:  Hypokalemia [E87.6] Pulmonary edema [J81.1] Influenza A [J10.1] Hypoxia [R09.02] Patient Active Problem List   Diagnosis Date Noted   Influenza A 03/31/2021   Acute respiratory failure with hypoxia (HCC)  03/31/2021   Acute diarrhea 03/31/2021   Hypokalemia due to excessive gastrointestinal loss of potassium 03/31/2021   Mixed hyperlipidemia due to type 2 diabetes mellitus (HCC) 03/31/2021   GERD without esophagitis 03/31/2021   Lactic acidosis 03/31/2021   Elevated brain natriuretic peptide (BNP) level 03/31/2021   Sepsis, viral (HCC) 03/31/2021   Uncontrolled type 2 diabetes mellitus with hyperglycemia (HCC) 08/23/2018   Morbid obesity (HCC) 08/23/2018   Chest pain of uncertain etiology 08/22/2018   Type 2 diabetes mellitus with hyperglycemia, with long-term current use of insulin (HCC) 08/26/2013   Essential hypertension 06/25/2011   PCP:  Ralene Ok, MD Pharmacy:   [facility] [phone] - [place], [place] - [address] AT [facility] OF [address] [facility] [address] [place] [address] Phone: [phone] Fax: [phone]  [facility] [phone] - [place], [place] - [address] AT [facility] OF [address] & [address] [address]  [address] Phone: [phone] Fax: [phone]     Social Determinants of Health (SDOH) Interventions    Readmission Risk Interventions No flowsheet data found.

## 2021-04-03 NOTE — Progress Notes (Signed)
SATURATION QUALIFICATIONS: (This note is used to comply with regulatory documentation for home oxygen)  Patient Saturations on Room Air at Rest = 94%  Patient Saturations on Room Air while Ambulating = 88%  Patient Saturations on 2 Liters of oxygen while Ambulating = 98%  Please briefly explain why patient needs home oxygen:

## 2021-04-03 NOTE — Discharge Summary (Addendum)
Physician Discharge Summary  Jaime Daugherty FXT:[id number] DOB: 06-19-1970 DOA: 03/31/2021  PCP: [name], MD  Admit date: 03/31/2021 Discharge date: 04/03/2021  Admitted From: Home Disposition: Home Recommendations for Outpatient Follow-up:  Follow up with PCP in 1-2 weeks Please obtain BMP/CBC in one week  Home Health: None Equipment/Devices: 2 L of oxygen  Discharge Condition: Stable CODE STATUS: Full code Diet recommendation: Cardiac Brief/Interim Summary:51 year old female admitted with shortness of breath and diarrhea.  She has history of hypertension diabetes obesity hyperlipidemia and GERD.  She was found to have influenza A positive.  COVID was negative.  She was febrile at 102.4 with tachypnea and tachycardia lactic acidosis.  CT of the chest showed no evidence of pulmonary embolism but had patchy bilateral groundglass airspace opacities consistent with viral pneumonitis.  She was given IV fluids.   Discharge Diagnoses:  Principal Problem:   Acute respiratory failure with hypoxia (HCC) Active Problems:   Essential hypertension   Type 2 diabetes mellitus with hyperglycemia, with long-term current use of insulin (HCC)   Influenza A   Acute diarrhea   Hypokalemia due to excessive gastrointestinal loss of potassium   Mixed hyperlipidemia due to type 2 diabetes mellitus (HCC)   GERD without esophagitis   Lactic acidosis   Elevated brain natriuretic peptide (BNP) level   Sepsis, viral (HCC)     #1 acute hypoxic respiratory failure secondary to influenza A pneumonia-patient started on Tamiflu.  Her saturation was 70s on room air on admission.  She was treated with Tamiflu nebulizer and oxygen.  On the day of discharge her oxygen saturation on room air after ambulation was 88%.  She was discharged on prednisone taper Tamiflu and O2 at 2 L.   #2 sepsis present on admission she met multiple criteria with fever tachypnea tachycardia and lactic acidosis secondary to  influenza A.  Continue Tamiflu.  Follow-up blood cultures no growth.  Lactic acid and procalcitonin trended down and resolved prior to discharge.     #3 diarrhea likely secondary to influenza A resolved  #4 essential hypertension blood pressure 138/67 continue home meds   #5 type 2 diabetes continue home meds. Hemoglobin A1c 6.3   #6 hyperlipidemia continue statin   #7 GERD continue PPI    Estimated body mass index is 44.6 kg/m as calculated from the following:   Height as of this encounter: $RemoveBeforeD'5\' 5"'[id number]$  (1.651 m).   Weight as of this encounter: 121.6 kg.  Discharge Instructions  Discharge Instructions     Diet - low sodium heart healthy   Complete by: As directed    Diet - low sodium heart healthy   Complete by: As directed    Increase activity slowly   Complete by: As directed    Increase activity slowly   Complete by: As directed       Allergies as of 04/03/2021   No Known Allergies      Medication List     STOP taking these medications    methocarbamol 500 MG tablet Commonly known as: ROBAXIN   omeprazole 20 MG capsule Commonly known as: PRILOSEC   potassium chloride 10 MEQ tablet Commonly known as: KLOR-CON M   predniSONE 10 MG (21) Tbpk tablet Commonly known as: STERAPRED UNI-PAK 21 TAB Replaced by: predniSONE 10 MG tablet   traMADol 50 MG tablet Commonly known as: ULTRAM       TAKE these medications    acetaminophen 500 MG tablet Commonly known as: TYLENOL Take 500 mg by mouth every  6 (six) hours as needed for headache or mild pain.   albuterol 108 (90 Base) MCG/ACT inhaler Commonly known as: VENTOLIN HFA Inhale 2 puffs into the lungs every 6 (six) hours as needed for wheezing or shortness of breath.   brompheniramine-pseudoephedrine-DM 30-2-10 MG/5ML syrup Take 10 mLs by mouth every 4 (four) hours as needed.   furosemide 40 MG tablet Commonly known as: LASIX Take 40 mg by mouth every other day.   insulin glargine 100 UNIT/ML Solostar  Pen Commonly known as: Lantus SoloStar Inject 30 Units into the skin daily. What changed: how much to take   oseltamivir 75 MG capsule Commonly known as: TAMIFLU Take 1 capsule (75 mg total) by mouth 2 (two) times daily.   predniSONE 10 MG tablet Commonly known as: DELTASONE Take 30 mg daily for 3 days then  20 mg daily for 3 days Then 10 mg daily till all done Replaces: predniSONE 10 MG (21) Tbpk tablet   rosuvastatin 20 MG tablet Commonly known as: Crestor Take 1 tablet (20 mg total) by mouth at bedtime.               Durable Medical Equipment  (From admission, onward)           Start     Ordered   04/03/21 1536  DME Oxygen  Once       Question Answer Comment  Length of Need 6 Months   Mode or (Route) Nasal cannula   Liters per Minute 2   Frequency Continuous (stationary and portable oxygen unit needed)   Oxygen conserving device Yes   Oxygen delivery system Gas      04/03/21 1535            No Known Allergies  Consultations: None   Procedures/Studies: DG Chest 1 View  Result Date: 04/01/2021 CLINICAL DATA:  Dyspnea EXAM: CHEST  1 VIEW COMPARISON:  03/31/2021 FINDINGS: The heart size and mediastinal contours are within normal limits. Both lungs are clear. The visualized skeletal structures are unremarkable. IMPRESSION: No active disease. Electronically Signed   By: Fidela Salisbury M.D.   On: 04/01/2021 03:53   DG Chest 2 View  Result Date: 03/31/2021 CLINICAL DATA:  A 51 year old female presents for evaluation of shortness of breath. EXAM: CHEST - 2 VIEW COMPARISON:  Comparison is made with February 06, 2018. FINDINGS: EKG leads project over the chest. Study limited by patient body habitus and portable technique. Trachea midline. Cardiomediastinal contours and hilar structures are stable. No signs of lobar consolidation. No visible pneumothorax. No sign of pleural effusion. On limited assessment there is no acute skeletal process. IMPRESSION: No  acute cardiopulmonary disease. Electronically Signed   By: Zetta Bills M.D.   On: 03/31/2021 16:46   CT Angio Chest PE W and/or Wo Contrast  Result Date: 03/31/2021 CLINICAL DATA:  Abdominal pain Sudden onset of increased shortness of breath for 2.5 hours EXAM: CT ANGIOGRAPHY CHEST CT ABDOMEN AND PELVIS WITH CONTRAST TECHNIQUE: Multidetector CT imaging of the chest was performed using the standard protocol during bolus administration of intravenous contrast. Multiplanar CT image reconstructions and MIPs were obtained to evaluate the vascular anatomy. Multidetector CT imaging of the abdomen and pelvis was performed using the standard protocol during bolus administration of intravenous contrast. CONTRAST:  143mL OMNIPAQUE IOHEXOL 350 MG/ML SOLN COMPARISON:  03/31/2021, 06/05/2018 FINDINGS: CTA CHEST FINDINGS Cardiovascular: This is a technically adequate evaluation of the pulmonary vasculature. No filling defects or pulmonary emboli. The heart is unremarkable without pericardial  effusion. No evidence of thoracic aortic aneurysm or dissection. Mediastinum/Nodes: Right paratracheal adenopathy measures up to 17 mm in short axis, reference image 27/1. Right hilar lymph node measures up to 12 mm in short axis, reference image 42/1. No other pathologic adenopathy within the mediastinum or hila. Thyroid, trachea, and esophagus appear unremarkable. Lungs/Pleura: There are patchy scattered ground-glass opacities throughout the lungs bilaterally, in a pattern that suggests atypical infection such as COVID-19. No effusion or pneumothorax. Central airways are patent. Musculoskeletal: No acute or destructive bony lesions. Reconstructed images demonstrate no additional findings. Review of the MIP images confirms the above findings. CT ABDOMEN and PELVIS FINDINGS Hepatobiliary: No focal liver abnormality is seen. Status post cholecystectomy. No biliary dilatation. Pancreas: Unremarkable. No pancreatic ductal dilatation or  surrounding inflammatory changes. Spleen: Normal in size without focal abnormality. Adrenals/Urinary Tract: Fat containing right adrenal mass measuring 5.0 x 5.5 x 5.5 cm consistent with myelolipoma, slightly increased in size since prior study. Left adrenal is stable. The kidneys enhance normally and symmetrically. No urinary tract calculi or obstructive uropathy. Bladder is unremarkable. Stomach/Bowel: No bowel obstruction or ileus. Normal appendix right lower quadrant. No bowel wall thickening or inflammatory change. Vascular/Lymphatic: Aortic atherosclerosis. No enlarged abdominal or pelvic lymph nodes. Reproductive: Postsurgical changes from supracervical hysterectomy. No adnexal masses. Other: No free fluid or free gas.  No abdominal wall hernia. Musculoskeletal: No acute or destructive bony lesions. Reconstructed images demonstrate no additional findings. Review of the MIP images confirms the above findings. IMPRESSION: 1. No evidence of pulmonary embolus. 2. Patchy bilateral ground-glass airspace disease, most consistent with inflammation or infection. Pattern is typical for COVID-19. 3. Mediastinal and right hilar adenopathy, likely reactive. 4. No acute intra-abdominal or intrapelvic process. 5. 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 No evidence of associated hemorrhage. Nonemergent surgical follow-up recommended. Electronically Signed   By: Randa Ngo M.D.   On: 03/31/2021 17:55   CT ABDOMEN PELVIS W CONTRAST  Result Date: 03/31/2021 CLINICAL DATA:  Abdominal pain Sudden onset of increased shortness of breath for 2.5 hours EXAM: CT ANGIOGRAPHY CHEST CT ABDOMEN AND PELVIS WITH CONTRAST TECHNIQUE: Multidetector CT imaging of the chest was performed using the standard protocol during bolus administration of intravenous contrast. Multiplanar CT image reconstructions and MIPs were obtained to evaluate the vascular anatomy. Multidetector CT imaging of the abdomen and pelvis  was performed using the standard protocol during bolus administration of intravenous contrast. CONTRAST:  127mL OMNIPAQUE IOHEXOL 350 MG/ML SOLN COMPARISON:  03/31/2021, 06/05/2018 FINDINGS: CTA CHEST FINDINGS Cardiovascular: This is a technically adequate evaluation of the pulmonary vasculature. No filling defects or pulmonary emboli. The heart is unremarkable without pericardial effusion. No evidence of thoracic aortic aneurysm or dissection. Mediastinum/Nodes: Right paratracheal adenopathy measures up to 17 mm in short axis, reference image 27/1. Right hilar lymph node measures up to 12 mm in short axis, reference image 42/1. No other pathologic adenopathy within the mediastinum or hila. Thyroid, trachea, and esophagus appear unremarkable. Lungs/Pleura: There are patchy scattered ground-glass opacities throughout the lungs bilaterally, in a pattern that suggests atypical infection such as COVID-19. No effusion or pneumothorax. Central airways are patent. Musculoskeletal: No acute or destructive bony lesions. Reconstructed images demonstrate no additional findings. Review of the MIP images confirms the above findings. CT ABDOMEN and PELVIS FINDINGS Hepatobiliary: No focal liver abnormality is seen. Status post cholecystectomy. No biliary dilatation. Pancreas: Unremarkable. No pancreatic ductal dilatation or surrounding inflammatory changes. Spleen: Normal in size without focal abnormality. Adrenals/Urinary Tract: Fat  containing right adrenal mass measuring 5.0 x 5.5 x 5.5 cm consistent with myelolipoma, slightly increased in size since prior study. Left adrenal is stable. The kidneys enhance normally and symmetrically. No urinary tract calculi or obstructive uropathy. Bladder is unremarkable. Stomach/Bowel: No bowel obstruction or ileus. Normal appendix right lower quadrant. No bowel wall thickening or inflammatory change. Vascular/Lymphatic: Aortic atherosclerosis. No enlarged abdominal or pelvic lymph nodes.  Reproductive: Postsurgical changes from supracervical hysterectomy. No adnexal masses. Other: No free fluid or free gas.  No abdominal wall hernia. Musculoskeletal: No acute or destructive bony lesions. Reconstructed images demonstrate no additional findings. Review of the MIP images confirms the above findings. IMPRESSION: 1. No evidence of pulmonary embolus. 2. Patchy bilateral ground-glass airspace disease, most consistent with inflammation or infection. Pattern is typical for COVID-19. 3. Mediastinal and right hilar adenopathy, likely reactive. 4. No acute intra-abdominal or intrapelvic process. 5. 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 No evidence of associated hemorrhage. Nonemergent surgical follow-up recommended. Electronically Signed   By: Randa Ngo M.D.   On: 03/31/2021 17:55   ECHOCARDIOGRAM COMPLETE  Result Date: 04/01/2021    ECHOCARDIOGRAM REPORT   Patient Name:   Jaime Daugherty Date of Exam: 04/01/2021 Medical Rec #:  [id number]        Height:       65.0 in Accession #:    [id number]       Weight:       265.0 lb Date of Birth:  March 08, 1971        BSA:          2.229 m Patient Age:    26 years         BP:           142/126 mmHg Patient Gender: F                HR:           92 bpm. Exam Location:  Inpatient Procedure: 2D Echo, Cardiac Doppler, Color Doppler and Intracardiac            Opacification Agent Indications:    CHF-Acute Diastolic D14.97  History:        Patient has no prior history of Echocardiogram examinations.                 Risk Factors:Hypertension, Diabetes and Dyslipidemia.  Sonographer:    Darlina Sicilian RDCS Referring Phys: [id number] Clifford  Sonographer Comments: Technically difficult study due to poor echo windows. IMPRESSIONS  1. Left ventricular ejection fraction, by estimation, is 65 to 70%. The left ventricle has normal function. The left ventricle has no regional wall motion abnormalities. There is mild left ventricular hypertrophy.  Left ventricular diastolic parameters are consistent with Grade I diastolic dysfunction (impaired relaxation).  2. Right ventricular systolic function is normal. The right ventricular size is normal.  3. The mitral valve was not well visualized. No evidence of mitral valve regurgitation.  4. The aortic valve is tricuspid. Aortic valve regurgitation is not visualized.  5. The inferior vena cava is dilated in size with <50% respiratory variability, suggesting right atrial pressure of 15 mmHg. Comparison(s): No prior Echocardiogram. FINDINGS  Left Ventricle: Left ventricular ejection fraction, by estimation, is 65 to 70%. The left ventricle has normal function. The left ventricle has no regional wall motion abnormalities. Definity contrast agent was given IV to delineate the left ventricular  endocardial borders. The left ventricular internal cavity size was normal in  size. There is mild left ventricular hypertrophy. Left ventricular diastolic parameters are consistent with Grade I diastolic dysfunction (impaired relaxation). Indeterminate filling pressures. Right Ventricle: The right ventricular size is normal. No increase in right ventricular wall thickness. Right ventricular systolic function is normal. Left Atrium: Left atrial size was normal in size. Right Atrium: Right atrial size was normal in size. Pericardium: There is no evidence of pericardial effusion. Mitral Valve: The mitral valve was not well visualized. No evidence of mitral valve regurgitation. Tricuspid Valve: The tricuspid valve is grossly normal. Tricuspid valve regurgitation is trivial. Aortic Valve: The aortic valve is tricuspid. Aortic valve regurgitation is not visualized. Pulmonic Valve: The pulmonic valve was grossly normal. Pulmonic valve regurgitation is trivial. Aorta: The aortic root and ascending aorta are structurally normal, with no evidence of dilitation. Venous: The inferior vena cava is dilated in size with less than 50% respiratory  variability, suggesting right atrial pressure of 15 mmHg. IAS/Shunts: No atrial level shunt detected by color flow Doppler.  LEFT VENTRICLE PLAX 2D LVIDd:         4.30 cm   Diastology LVIDs:         2.80 cm   LV e' medial:    5.98 cm/s LV PW:         1.20 cm   LV E/e' medial:  12.5 LV IVS:        1.40 cm   LV e' lateral:   7.40 cm/s LVOT diam:     1.50 cm   LV E/e' lateral: 10.1 LVOT Area:     1.77 cm  RIGHT VENTRICLE RV S prime:     14.90 cm/s TAPSE (M-mode): 1.8 cm LEFT ATRIUM           Index        RIGHT ATRIUM           Index LA diam:      3.60 cm 1.61 cm/m   RA Area:     13.50 cm LA Vol (A2C): 43.6 ml 19.56 ml/m  RA Volume:   32.10 ml  14.40 ml/m   AORTA Ao Root diam: 2.20 cm Ao Asc diam:  2.70 cm MITRAL VALVE MV Area (PHT): 3.23 cm    SHUNTS MV Decel Time: 235 msec    Systemic Diam: 1.50 cm MV E velocity: 74.60 cm/s MV A velocity: 67.30 cm/s MV E/A ratio:  1.11 Lyman Bishop MD Electronically signed by Lyman Bishop MD Signature Date/Time: 04/01/2021/2:37:53 PM    Final    (Echo, Carotid, EGD, Colonoscopy, ERCP)    Subjective:  Patient is awake alert anxious to go home Discharge Exam: Vitals:   04/03/21 0908 04/03/21 1503  BP:    Pulse:    Resp:    Temp:    SpO2: 91% 91%   Vitals:   04/03/21 0527 04/03/21 0535 04/03/21 0908 04/03/21 1503  BP: (!) 146/59     Pulse: 87 85    Resp: 20     Temp: 99.4 F (37.4 C)     TempSrc: Oral     SpO2: (!) 89% 95% 91% 91%  Weight:      Height:        General: Pt is alert, awake, not in acute distress Cardiovascular: RRR, S1/S2 +, no rubs, no gallops Respiratory: wheezing bilaterally, no wheezing, no rhonchi Abdominal: Soft, NT, ND, bowel sounds + Extremities: no edema, no cyanosis    The results of significant diagnostics from this hospitalization (including imaging, microbiology, ancillary and  laboratory) are listed below for reference.     Microbiology: Recent Results (from the past 240 hour(s))  Culture, blood (routine x 2)      Status: None (Preliminary result)   Collection Time: 03/31/21  4:21 PM   Specimen: BLOOD  Result Value Ref Range Status   Specimen Description   Final    BLOOD RIGHT ANTECUBITAL Performed at [facility] [address], [place], [place] [address]    Special Requests   Final    BOTTLES DRAWN AEROBIC AND ANAEROBIC Blood Culture results may not be optimal due to an excessive volume of blood received in culture bottles Performed at [facility] [address] [place], [place] [address]    Culture   Final    NO GROWTH 2 DAYS Performed at [facility] [address] [place], [place] [address]    Report Status PENDING  Incomplete  Culture, blood (routine x 2)     Status: None (Preliminary result)   Collection Time: 03/31/21  4:26 PM   Specimen: BLOOD  Result Value Ref Range Status   Specimen Description   Final    BLOOD LEFT ANTECUBITAL Performed at [facility] [address], [place], [place] [address]    Special Requests   Final    BOTTLES DRAWN AEROBIC AND ANAEROBIC Blood Culture adequate volume Performed at [facility] [address] [place], [place] [address]    Culture   Final    NO GROWTH 2 DAYS Performed at [facility] [address], [place],  [address]    Report Status PENDING  Incomplete  Resp Panel by RT-PCR (Flu A&B, Covid) Nasopharyngeal Swab     Status: Abnormal   Collection Time: 03/31/21  4:48 PM   Specimen: Nasopharyngeal Swab; Nasopharyngeal(NP) swabs in vial transport medium  Result Value Ref Range Status   SARS Coronavirus 2 by RT PCR NEGATIVE NEGATIVE Final    Comment: (NOTE) SARS-CoV-2 target nucleic acids are NOT DETECTED.  The SARS-CoV-2 RNA is generally detectable in upper respiratory specimens during the acute phase of infection. The lowest concentration of SARS-CoV-2 viral copies this assay can detect is 138 copies/mL. A negative result does not  preclude SARS-Cov-2 infection and should not be used as the sole basis for treatment or other patient management decisions. A negative result may occur with  improper specimen collection/handling, submission of specimen other than nasopharyngeal swab, presence of viral mutation(s) within the areas targeted by this assay, and inadequate number of viral copies(<138 copies/mL). A negative result must be combined with clinical observations, patient history, and epidemiological information. The expected result is Negative.  Fact Sheet for Patients:  EntrepreneurPulse.com.au  Fact Sheet for Healthcare Providers:  IncredibleEmployment.be  This test is no t yet approved or cleared by the [place] FDA and  has been authorized for detection and/or diagnosis of SARS-CoV-2 by FDA under an Emergency Use Authorization (EUA). This EUA will remain  in effect (meaning this test can be used) for the duration of the COVID-19 declaration under Section 564(b)(1) of the Act, 21 U.S.C.section 360bbb-3(b)(1), unless the authorization is terminated  or revoked sooner.       Influenza A by PCR POSITIVE (A) NEGATIVE Final   Influenza B by PCR NEGATIVE NEGATIVE Final    Comment: (NOTE) The Xpert Xpress SARS-CoV-2/FLU/RSV plus assay is intended as an aid in the diagnosis of influenza from Nasopharyngeal swab specimens and should not be used as  a sole basis for treatment. Nasal washings and aspirates are unacceptable for Xpert Xpress SARS-CoV-2/FLU/RSV testing.  Fact Sheet for Patients: EntrepreneurPulse.com.au  Fact Sheet for Healthcare Providers: IncredibleEmployment.be  This test is not yet approved or cleared by the [place] FDA and has been authorized for detection and/or diagnosis of SARS-CoV-2 by FDA under an Emergency Use Authorization (EUA). This EUA will remain in effect (meaning this test can be used) for the  duration of the COVID-19 declaration under Section 564(b)(1) of the Act, 21 U.S.C. section 360bbb-3(b)(1), unless the authorization is terminated or revoked.  Performed at [facility], [place] [address] [place], [place] [address]      Labs: BNP (last 3 results) Recent Labs    03/31/21 1621  BNP 604.5*   Basic Metabolic Panel: Recent Labs  Lab 03/31/21 1611 03/31/21 1712 04/01/21 0451  NA 138  --  141  K 2.7*  --  4.2  CL 102  --  104  CO2 25  --  30  GLUCOSE 140*  --  166*  BUN 8  --  12  CREATININE 0.73  --  0.54  CALCIUM 8.1*  --  8.4*  MG  --  2.0 2.4   Liver Function Tests: Recent Labs  Lab 03/31/21 1611 04/01/21 0451  AST 42* 38  ALT 33 31  ALKPHOS 97 93  BILITOT 0.7 0.5  PROT 7.1 6.8  ALBUMIN 3.8 3.6   No results for input(s): LIPASE, AMYLASE in the last 168 hours. No results for input(s): AMMONIA in the last 168 hours. CBC: Recent Labs  Lab 03/31/21 1611 04/01/21 0451  WBC 9.7 9.5  NEUTROABS 7.9* 8.1*  HGB 12.7 12.6  HCT 41.3 40.5  MCV 72.2* 72.8*  PLT 177 193   Cardiac Enzymes: No results for input(s): CKTOTAL, CKMB, CKMBINDEX, TROPONINI in the last 168 hours. BNP: Invalid input(s): POCBNP CBG: Recent Labs  Lab 04/02/21 1632 04/02/21 1938 04/02/21 2127 04/03/21 0755 04/03/21 1145  GLUCAP 166* 296* 142* 88 117*   D-Dimer No results for input(s): DDIMER in the last 72 hours. Hgb A1c Recent Labs    04/01/21 0451  HGBA1C 6.3*   Lipid Profile No results for input(s): CHOL, HDL, LDLCALC, TRIG, CHOLHDL, LDLDIRECT in the last 72 hours. Thyroid function studies No results for input(s): TSH, T4TOTAL, T3FREE, THYROIDAB in the last 72 hours.  Invalid input(s): FREET3 Anemia work up No results for input(s): VITAMINB12, FOLATE, FERRITIN, TIBC, IRON, RETICCTPCT in the last 72 hours. Urinalysis    Component Value Date/Time   COLORURINE COLORLESS (A) 03/31/2021 1819   APPEARANCEUR CLEAR 03/31/2021 1819   LABSPEC  1.010 03/31/2021 1819   PHURINE 5.0 03/31/2021 1819   GLUCOSEU NEGATIVE 03/31/2021 1819   HGBUR SMALL (A) 03/31/2021 1819   BILIRUBINUR NEGATIVE 03/31/2021 1819   BILIRUBINUR negative 06/16/2015 0914   BILIRUBINUR neg 11/12/2013 0930   KETONESUR NEGATIVE 03/31/2021 1819   PROTEINUR NEGATIVE 03/31/2021 1819   UROBILINOGEN 0.2 03/08/2020 1644   NITRITE NEGATIVE 03/31/2021 1819   LEUKOCYTESUR NEGATIVE 03/31/2021 1819   Sepsis Labs Invalid input(s): PROCALCITONIN,  WBC,  LACTICIDVEN Microbiology Recent Results (from the past 240 hour(s))  Culture, blood (routine x 2)     Status: None (Preliminary result)   Collection Time: 03/31/21  4:21 PM   Specimen: BLOOD  Result Value Ref Range Status   Specimen Description   Final    BLOOD RIGHT ANTECUBITAL Performed at [facility], [place] [address], [place], [place] [address]  Special Requests   Final    BOTTLES DRAWN AEROBIC AND ANAEROBIC Blood Culture results may not be optimal due to an excessive volume of blood received in culture bottles Performed at [facility] [address], [place], [place] [address]    Culture   Final    NO GROWTH 2 DAYS Performed at [facility] [address] [place], [place] [address]    Report Status PENDING  Incomplete  Culture, blood (routine x 2)     Status: None (Preliminary result)   Collection Time: 03/31/21  4:26 PM   Specimen: BLOOD  Result Value Ref Range Status   Specimen Description   Final    BLOOD LEFT ANTECUBITAL Performed at [facility] [address], [place], [place] [address]    Special Requests   Final    BOTTLES DRAWN AEROBIC AND ANAEROBIC Blood Culture adequate volume Performed at [facility] [address] [place], [place] [address]    Culture   Final    NO GROWTH 2 DAYS Performed at [facility] [address], [place], [place] [address]    Report Status PENDING  Incomplete  Resp  Panel by RT-PCR (Flu A&B, Covid) Nasopharyngeal Swab     Status: Abnormal   Collection Time: 03/31/21  4:48 PM   Specimen: Nasopharyngeal Swab; Nasopharyngeal(NP) swabs in vial transport medium  Result Value Ref Range Status   SARS Coronavirus 2 by RT PCR NEGATIVE NEGATIVE Final    Comment: (NOTE) SARS-CoV-2 target nucleic acids are NOT DETECTED.  The SARS-CoV-2 RNA is generally detectable in upper respiratory specimens during the acute phase of infection. The lowest concentration of SARS-CoV-2 viral copies this assay can detect is 138 copies/mL. A negative result does not preclude SARS-Cov-2 infection and should not be used as the sole basis for treatment or other patient management decisions. A negative result may occur with  improper specimen collection/handling, submission of specimen other than nasopharyngeal swab, presence of viral mutation(s) within the areas targeted by this assay, and inadequate number of viral copies(<138 copies/mL). A negative result must be combined with clinical observations, patient history, and epidemiological information. The expected result is Negative.  Fact Sheet for Patients:  EntrepreneurPulse.com.au  Fact Sheet for Healthcare Providers:  IncredibleEmployment.be  This test is no t yet approved or cleared by the [place] FDA and  has been authorized for detection and/or diagnosis of SARS-CoV-2 by FDA under an Emergency Use Authorization (EUA). This EUA will remain  in effect (meaning this test can be used) for the duration of the COVID-19 declaration under Section 564(b)(1) of the Act, 21 U.S.C.section 360bbb-3(b)(1), unless the authorization is terminated  or revoked sooner.       Influenza A by PCR POSITIVE (A) NEGATIVE Final   Influenza B by PCR NEGATIVE NEGATIVE Final    Comment: (NOTE) The Xpert Xpress SARS-CoV-2/FLU/RSV plus assay is intended as an aid in the diagnosis of influenza from  Nasopharyngeal swab specimens and should not be used as a sole basis for treatment. Nasal washings and aspirates are unacceptable for Xpert Xpress SARS-CoV-2/FLU/RSV testing.  Fact Sheet for Patients: EntrepreneurPulse.com.au  Fact Sheet for Healthcare Providers: IncredibleEmployment.be  This test is not yet approved or cleared by the [place] FDA and has been authorized for detection and/or diagnosis of SARS-CoV-2 by FDA under an Emergency Use Authorization (EUA). This EUA will remain in effect (meaning this test can be used) for the duration of the COVID-19  declaration under Section 564(b)(1) of the Act, 21 U.S.C. section 360bbb-3(b)(1), unless the authorization is terminated or revoked.  Performed at [facility], [place] [address], [place], [place] [address]      Time coordinating discharge:39 minutes  SIGNED:   Georgette Shell, MD  [facility] 04/03/2021, 4:31 PM

## 2021-04-03 NOTE — Clinical Social Work Note (Signed)
°  Transition of Care [facility]) Screening Note   Patient Details  Name: Jaime Daugherty Date of Birth: 04/23/1970   Transition of Care [facility]) CM/SW Contact:    Ida Rogue, LCSW Phone Number: 04/03/2021, 9:18 AM    Transition of Care Department [facility]) has reviewed patient and no TOC needs have been identified at this time. We will continue to monitor patient advancement through interdisciplinary progression rounds. If new patient transition needs arise, please place a TOC consult.

## 2021-04-06 LAB — CULTURE, BLOOD (ROUTINE X 2)
Culture: NO GROWTH
Culture: NO GROWTH
Special Requests: ADEQUATE

## 2021-05-27 ENCOUNTER — Encounter: Payer: Self-pay | Admitting: Emergency Medicine

## 2021-05-27 ENCOUNTER — Ambulatory Visit
Admission: EM | Admit: 2021-05-27 | Discharge: 2021-05-27 | Disposition: A | Discharge status: Home / Self Care | Payer: BC Managed Care – PPO

## 2021-05-27 ENCOUNTER — Other Ambulatory Visit: Payer: Self-pay

## 2021-05-27 DIAGNOSIS — M25561 Pain in right knee: Secondary | ICD-10-CM

## 2021-05-27 MED ORDER — KETOROLAC TROMETHAMINE 30 MG/ML IJ SOLN
30.0000 mg | Freq: Once | INTRAMUSCULAR | Status: AC
  Administered 2021-05-27: 30 mg via INTRAMUSCULAR

## 2021-05-27 MED ORDER — PREDNISONE 20 MG PO TABS: 40.0000 mg | ORAL_TABLET | Freq: Every day | ORAL | 0 refills | Status: AC

## 2021-05-27 NOTE — ED Triage Notes (Signed)
Patient c/o right knee pain for several days, no injury.  Patient has taken Tylenol for the pain.

## 2021-05-27 NOTE — ED Provider Notes (Signed)
Jaime Daugherty    CSN: [id number] Arrival date & time: 05/27/21  0938      History   Chief Complaint Chief Complaint  Patient presents with   Knee Pain    HPI Jaime Daugherty is a 51 y.o. female.   Patient presents with right knee pain that has been present over the past 3 days.  She denies any apparent injury.  Patient reports that she has had knee pain in this knee in the past and thinks that she has been told that she has osteoarthritis.  She was given a medication for this in the past but is not sure the name of it.  Denies any numbness or tingling.  Patient is able to bear weight.  Pain is exacerbated with extending the leg.  Patient has taken Tylenol for pain with minimal improvement.   Knee Pain  Past Medical History:  Diagnosis Date   Chest pain of uncertain etiology 08/22/2018   Diabetes mellitus without complication (HCC)    Hypertension     Patient Active Problem List   Diagnosis Date Noted   Influenza A 03/31/2021   Acute respiratory failure with hypoxia (HCC) 03/31/2021   Acute diarrhea 03/31/2021   Hypokalemia due to excessive gastrointestinal loss of potassium 03/31/2021   Mixed hyperlipidemia due to type 2 diabetes mellitus (HCC) 03/31/2021   GERD without esophagitis 03/31/2021   Lactic acidosis 03/31/2021   Elevated brain natriuretic peptide (BNP) level 03/31/2021   Sepsis, viral (HCC) 03/31/2021   Uncontrolled type 2 diabetes mellitus with hyperglycemia (HCC) 08/23/2018   Morbid obesity (HCC) 08/23/2018   Chest pain of uncertain etiology 08/22/2018   Type 2 diabetes mellitus with hyperglycemia, with long-term current use of insulin (HCC) 08/26/2013   Essential hypertension 06/25/2011    Past Surgical History:  Procedure Laterality Date   ABDOMINAL HYSTERECTOMY     CHOLECYSTECTOMY      OB History   No obstetric history on file.      Home Medications    Prior to Admission medications   Medication Sig Start Date End Date Taking?  Authorizing Provider  acetaminophen (TYLENOL) 500 MG tablet Take 500 mg by mouth every 6 (six) hours as needed for headache or mild pain.   Yes [provider]  empagliflozin (JARDIANCE) 25 MG TABS tablet Take 1 tablet by mouth daily.   Yes [provider]  furosemide (LASIX) 40 MG tablet Take 40 mg by mouth every other day. 11/02/17  Yes [provider]  Insulin Glargine (LANTUS SOLOSTAR) 100 UNIT/ML Solostar Pen Inject 30 Units into the skin daily. Patient taking differently: Inject 60 Units into the skin daily. 01/04/17  Yes Jaime Belling, MD  lisinopril-hydrochlorothiazide (ZESTORETIC) 20-12.5 MG tablet 1 tablet   Yes [provider]  predniSONE (DELTASONE) 20 MG tablet Take 2 tablets (40 mg total) by mouth daily for 5 days. 05/27/21 06/01/21 Yes Donat Humble, Acie Fredrickson, FNP  albuterol (PROVENTIL HFA;VENTOLIN HFA) 108 (90 Base) MCG/ACT inhaler Inhale 2 puffs into the lungs every 6 (six) hours as needed for wheezing or shortness of breath. 02/06/18   Jaime Sidle, MD  benzonatate (TESSALON PERLES) 100 MG capsule Take 1 capsule (100 mg total) by mouth every 6 (six) hours as needed for cough. 04/03/21 04/03/22  Jaime Ren, MD  brompheniramine-pseudoephedrine-DM 30-2-10 MG/5ML syrup Take 10 mLs by mouth every 4 (four) hours as needed. 03/30/21   [provider]  oseltamivir (TAMIFLU) 75 MG capsule Take 1 capsule (75 mg total) by mouth  2 (two) times daily. 04/03/21   Jaime Ren, MD  rosuvastatin (CRESTOR) 20 MG tablet Take 1 tablet (20 mg total) by mouth at bedtime. 08/22/18 08/22/19  Elder Negus, MD    Family History Family History  Problem Relation Age of Onset   Hypertension Mother    Hypertension Sister    Diabetes Paternal Grandmother    Breast cancer Maternal Aunt     Social History Social History   Tobacco Use   Smoking status: Former    Packs/day: 0.50    Types: Cigarettes    Quit date: 12/2019    Years since quitting: 1.4    Smokeless tobacco: Never  Vaping Use   Vaping Use: Never used  Substance Use Topics   Alcohol use: Yes    Alcohol/week: 0.0 standard drinks    Comment: socially   Drug use: No     Allergies   Patient has no known allergies.   Review of Systems Review of Systems Per HPI  Physical Exam Triage Vital Signs ED Triage Vitals [05/27/21 1127]  Enc Vitals Group     BP (!) 142/78     Pulse Rate 92     Resp 18     Temp 98.3 F (36.8 C)     Temp Source Oral     SpO2 98 %     Weight 270 lb (122.5 kg)     Height 5\' 5"  (1.651 m)     Head Circumference      Peak Flow      Pain Score 9     Pain Loc      Pain Edu?      Excl. in GC?    No data found.  Updated Vital Signs BP (!) 142/78 (BP Location: Left Arm)    Pulse 92    Temp 98.3 F (36.8 C) (Oral)    Resp 18    Ht 5\' 5"  (1.651 m)    Wt 270 lb (122.5 kg)    SpO2 98%    BMI 44.93 kg/m   Visual Acuity Right Eye Distance:   Left Eye Distance:   Bilateral Distance:    Right Eye Near:   Left Eye Near:    Bilateral Near:     Physical Exam Constitutional:      General: She is not in acute distress.    Appearance: Normal appearance. She is not toxic-appearing or diaphoretic.  HENT:     Head: Normocephalic and atraumatic.  Eyes:     Extraocular Movements: Extraocular movements intact.     Conjunctiva/sclera: Conjunctivae normal.  Pulmonary:     Effort: Pulmonary effort is normal.  Musculoskeletal:     Right knee: No swelling, deformity, bony tenderness or crepitus. Tenderness present. No LCL laxity, MCL laxity, ACL laxity or PCL laxity. Normal alignment, normal meniscus and normal patellar mobility. Normal pulse.     Left knee: Normal.     Comments: Tenderness to palpation generalized throughout anterior knee.  No obvious swelling, erythema, bruising noted.  No abrasions or lacerations noted.  Patient has full range of motion of knee but with pain.  Neurovascular intact.  Neurological:     General: No focal deficit  present.     Mental Status: She is alert and oriented to person, place, and time. Mental status is at baseline.  Psychiatric:        Mood and Affect: Mood normal.        Behavior: Behavior normal.  Thought Content: Thought content normal.        Judgment: Judgment normal.     UC Treatments / Results  Labs (all labs ordered are listed, but only abnormal results are displayed) Labs Reviewed - No data to display  EKG   Radiology No results found.  Procedures Procedures (including critical Daugherty time)  Medications Ordered in UC Medications  ketorolac (TORADOL) 30 MG/ML injection 30 mg (30 mg Intramuscular Given 05/27/21 1144)    Initial Impression / Assessment and Plan / UC Course  I have reviewed the triage vital signs and the nursing notes.  Pertinent labs & imaging results that were available during my Daugherty of the patient were reviewed by me and considered in my medical decision making (see chart for details).     Suspect inflammation is causing knee pain and there is high suspicion for osteoarthritis.  I do not think that imaging is necessary at this time given no obvious injury.  Ketorolac IM administered in urgent Daugherty today.  Patient was also prescribed prednisone as she reports that she has taken this previously and her blood sugars have tolerated this well.  Low-dose and short course of prednisone was prescribed and patient was advised to monitor blood sugars very closely while on this medication.  Patient to apply ice.  Knee brace was also applied.  Patient to follow-up with orthopedist and/or PCP for further evaluation and management.  Patient verbalized understanding and was agreeable with plan. Final Clinical Impressions(s) / UC Diagnoses   Final diagnoses:  Acute pain of right knee     Discharge Instructions      You have been prescribed prednisone steroid to decrease inflammation associated with your knee pain.  Pain injection has also been administered  today.  Please avoid taking any ibuprofen, Advil Aleve for at least 24 hours following injection.  Apply ice to your knee.  A knee brace has been applied in urgent Daugherty today.  Follow-up with orthopedist or primary Daugherty doctor if pain persists or worsens.  Monitor your blood sugars while on prednisone as well.    ED Prescriptions     Medication Sig Dispense Auth. Provider   predniSONE (DELTASONE) 20 MG tablet Take 2 tablets (40 mg total) by mouth daily for 5 days. 10 tablet Gustavus Bryant, [place]      PDMP not reviewed this encounter.   Gustavus Bryant, [place] 05/27/21 1155

## 2021-05-27 NOTE — Discharge Instructions (Addendum)
You have been prescribed prednisone steroid to decrease inflammation associated with your knee pain.  Pain injection has also been administered today.  Please avoid taking any ibuprofen, Advil Aleve for at least 24 hours following injection.  Apply ice to your knee.  A knee brace has been applied in urgent care today.  Follow-up with orthopedist or primary care doctor if pain persists or worsens.  Monitor your blood sugars while on prednisone as well.

## 2021-08-10 ENCOUNTER — Encounter (HOSPITAL_COMMUNITY): Payer: Self-pay | Admitting: Emergency Medicine

## 2021-08-10 ENCOUNTER — Ambulatory Visit (HOSPITAL_COMMUNITY)
Admission: EM | Admit: 2021-08-10 | Discharge: 2021-08-10 | Disposition: A | Discharge status: Home / Self Care | Payer: BC Managed Care – PPO | Attending: Physician Assistant | Admitting: Physician Assistant

## 2021-08-10 DIAGNOSIS — R112 Nausea with vomiting, unspecified: Secondary | ICD-10-CM | POA: Diagnosis present

## 2021-08-10 DIAGNOSIS — R1010 Upper abdominal pain, unspecified: Secondary | ICD-10-CM | POA: Insufficient documentation

## 2021-08-10 LAB — POCT URINALYSIS DIPSTICK, ED / UC
Bilirubin Urine: NEGATIVE
Glucose, UA: NEGATIVE mg/dL
Ketones, ur: NEGATIVE mg/dL
Leukocytes,Ua: NEGATIVE
Nitrite: NEGATIVE
Protein, ur: NEGATIVE mg/dL
Specific Gravity, Urine: 1.01 (ref 1.005–1.030)
Urobilinogen, UA: 0.2 mg/dL (ref 0.0–1.0)
pH: 5 (ref 5.0–8.0)

## 2021-08-10 LAB — CBC WITH DIFFERENTIAL/PLATELET
Abs Immature Granulocytes: 0.07 10*3/uL (ref 0.00–0.07)
Basophils Absolute: 0 10*3/uL (ref 0.0–0.1)
Basophils Relative: 0 %
Eosinophils Absolute: 0.1 10*3/uL (ref 0.0–0.5)
Eosinophils Relative: 1 %
HCT: 42.2 % (ref 36.0–46.0)
Hemoglobin: 13 g/dL (ref 12.0–15.0)
Immature Granulocytes: 1 %
Lymphocytes Relative: 12 %
Lymphs Abs: 1.8 10*3/uL (ref 0.7–4.0)
MCH: 22.5 pg — ABNORMAL LOW (ref 26.0–34.0)
MCHC: 30.8 g/dL (ref 30.0–36.0)
MCV: 73 fL — ABNORMAL LOW (ref 80.0–100.0)
Monocytes Absolute: 1 10*3/uL (ref 0.1–1.0)
Monocytes Relative: 7 %
Neutro Abs: 12.3 10*3/uL — ABNORMAL HIGH (ref 1.7–7.7)
Neutrophils Relative %: 79 %
Platelets: 242 10*3/uL (ref 150–400)
RBC: 5.78 MIL/uL — ABNORMAL HIGH (ref 3.87–5.11)
RDW: 16.5 % — ABNORMAL HIGH (ref 11.5–15.5)
WBC: 15.3 10*3/uL — ABNORMAL HIGH (ref 4.0–10.5)
nRBC: 0 % (ref 0.0–0.2)

## 2021-08-10 LAB — COMPREHENSIVE METABOLIC PANEL
ALT: 17 U/L (ref 0–44)
AST: 20 U/L (ref 15–41)
Albumin: 3.6 g/dL (ref 3.5–5.0)
Alkaline Phosphatase: 102 U/L (ref 38–126)
Anion gap: 9 (ref 5–15)
BUN: 16 mg/dL (ref 6–20)
CO2: 28 mmol/L (ref 22–32)
Calcium: 8.9 mg/dL (ref 8.9–10.3)
Chloride: 105 mmol/L (ref 98–111)
Creatinine, Ser: 1.98 mg/dL — ABNORMAL HIGH (ref 0.44–1.00)
GFR, Estimated: 30 mL/min — ABNORMAL LOW (ref >60)
Glucose, Bld: 112 mg/dL — ABNORMAL HIGH (ref 70–99)
Potassium: 2.9 mmol/L — ABNORMAL LOW (ref 3.5–5.1)
Sodium: 142 mmol/L (ref 135–145)
Total Bilirubin: 1.1 mg/dL (ref 0.3–1.2)
Total Protein: 6.8 g/dL (ref 6.5–8.1)

## 2021-08-10 LAB — CBG MONITORING, ED: Glucose-Capillary: 129 mg/dL — ABNORMAL HIGH (ref 70–99)

## 2021-08-10 LAB — POC URINE PREG, ED: Preg Test, Ur: NEGATIVE

## 2021-08-10 LAB — LIPASE, BLOOD: Lipase: 30 U/L (ref 11–51)

## 2021-08-10 MED ORDER — ONDANSETRON 4 MG PO TBDP
4.0000 mg | ORAL_TABLET | Freq: Once | ORAL | Status: AC
  Administered 2021-08-10: 4 mg via ORAL

## 2021-08-10 MED ORDER — ONDANSETRON 4 MG PO TBDP
ORAL_TABLET | ORAL | Status: AC
  Filled 2021-08-10: qty 1

## 2021-08-10 MED ORDER — ONDANSETRON 4 MG PO TBDP: 4.0000 mg | ORAL_TABLET | Freq: Three times a day (TID) | ORAL | 0 refills | Status: DC | PRN

## 2021-08-10 NOTE — Discharge Instructions (Signed)
We will contact you for lab work is abnormal.  Please use Zofran on a regular basis for the next several days.  Make sure you eat a very bland diet and drink plenty of fluid.  If you have any worsening symptoms including severe abdominal pain, nausea/vomiting despite medication, blood in your vomit, blood in your stool, weakness you need to go to the emergency room.   ?

## 2021-08-10 NOTE — ED Provider Notes (Signed)
?[facility] ? ? ? ?CSN: [id number] ?Arrival date & time: 08/10/21  1845 ? ? ?  ? ?History   ?Chief Complaint ?Chief Complaint  ?Patient presents with  ? Abdominal Pain  ? Emesis  ? ? ?HPI ?Jaime Daugherty is a 51 y.o. female.  ? ?Patient presents today with a 1 day history of abdominal upset.  Reports that she has had generalized abdominal pain that is worse in her central and upper abdomen.  Pain is rated 7 on a 0-10 pain scale, described as cramping, no aggravating relieving factors identified.  She has had several episodes of nausea and vomiting without hematemesis.  She reports having a normal bowel movement earlier today and is able to pass gas.  She does have a history of diabetes and reports that she had a hypoglycemic episode prior to symptom onset but denies any significant hyperglycemia.  She denies history of DKA.  Denies any medication changes, suspicious food intake, known sick contacts, history of gastrointestinal disorder, recent antibiotic use.  She is status postcholecystectomy but denies additional abdominal surgeries.  She is having difficulty with daily activities as result of symptoms. ? ? ?Past Medical History:  ?Diagnosis Date  ? Chest pain of uncertain etiology 08/22/2018  ? Diabetes mellitus without complication (HCC)   ? Hypertension   ? ? ?Patient Active Problem List  ? Diagnosis Date Noted  ? Influenza A 03/31/2021  ? Acute respiratory failure with hypoxia (HCC) 03/31/2021  ? Acute diarrhea 03/31/2021  ? Hypokalemia due to excessive gastrointestinal loss of potassium 03/31/2021  ? Mixed hyperlipidemia due to type 2 diabetes mellitus (HCC) 03/31/2021  ? GERD without esophagitis 03/31/2021  ? Lactic acidosis 03/31/2021  ? Elevated brain natriuretic peptide (BNP) level 03/31/2021  ? Sepsis, viral (HCC) 03/31/2021  ? Uncontrolled type 2 diabetes mellitus with hyperglycemia (HCC) 08/23/2018  ? Morbid obesity (HCC) 08/23/2018  ? Chest pain of uncertain etiology 08/22/2018  ? Type 2  diabetes mellitus with hyperglycemia, with long-term current use of insulin (HCC) 08/26/2013  ? Essential hypertension 06/25/2011  ? ? ?Past Surgical History:  ?Procedure Laterality Date  ? ABDOMINAL HYSTERECTOMY    ? CHOLECYSTECTOMY    ? ? ?OB History   ?No obstetric history on file. ?  ? ? ? ?Home Medications   ? ?Prior to Admission medications   ?Medication Sig Start Date End Date Taking? Authorizing Provider  ?acetaminophen (TYLENOL) 500 MG tablet Take 500 mg by mouth every 6 (six) hours as needed for headache or mild pain.    [provider]  ?albuterol (PROVENTIL HFA;VENTOLIN HFA) 108 (90 Base) MCG/ACT inhaler Inhale 2 puffs into the lungs every 6 (six) hours as needed for wheezing or shortness of breath. 02/06/18   Elvina Sidle, MD  ?benzonatate (TESSALON PERLES) 100 MG capsule Take 1 capsule (100 mg total) by mouth every 6 (six) hours as needed for cough. 04/03/21 04/03/22  Alwyn Ren, MD  ?brompheniramine-pseudoephedrine-DM 30-2-10 MG/5ML syrup Take 10 mLs by mouth every 4 (four) hours as needed. 03/30/21   [provider]  ?empagliflozin (JARDIANCE) 25 MG TABS tablet Take 1 tablet by mouth daily.    [provider]  ?furosemide (LASIX) 40 MG tablet Take 40 mg by mouth every other day. 11/02/17   [provider]  ?Insulin Glargine (LANTUS SOLOSTAR) 100 UNIT/ML Solostar Pen Inject 30 Units into the skin daily. ?Patient taking differently: Inject 60 Units into the skin daily. 01/04/17   Romero Belling, MD  ?lisinopril-hydrochlorothiazide (ZESTORETIC) 20-12.5  MG tablet 1 tablet    [provider]  ?ondansetron (ZOFRAN-ODT) 4 MG disintegrating tablet Take 1 tablet (4 mg total) by mouth every 8 (eight) hours as needed for nausea or vomiting. 08/10/21   Nayelly Laughman, Noberto RetortErin K, PA-C  ?oseltamivir (TAMIFLU) 75 MG capsule Take 1 capsule (75 mg total) by mouth 2 (two) times daily. 04/03/21   Alwyn RenMathews, Elizabeth G, MD  ?rosuvastatin (CRESTOR) 20 MG tablet Take 1 tablet (20 mg  total) by mouth at bedtime. 08/22/18 08/22/19  Elder NegusPatwardhan, Manish J, MD  ? ? ?Family History ?Family History  ?Problem Relation Age of Onset  ? Hypertension Mother   ? Hypertension Sister   ? Diabetes Paternal Grandmother   ? Breast cancer Maternal Aunt   ? ? ?Social History ?Social History  ? ?Tobacco Use  ? Smoking status: Former  ?  Packs/day: 0.50  ?  Types: Cigarettes  ?  Quit date: 12/2019  ?  Years since quitting: 1.6  ? Smokeless tobacco: Never  ?Vaping Use  ? Vaping Use: Never used  ?Substance Use Topics  ? Alcohol use: Yes  ?  Alcohol/week: 0.0 standard drinks  ?  Comment: socially  ? Drug use: No  ? ? ? ?Allergies   ?Patient has no known allergies. ? ? ?Review of Systems ?Review of Systems  ?Constitutional:  Positive for activity change. Negative for appetite change, fatigue and fever.  ?Respiratory:  Negative for cough and shortness of breath.   ?Cardiovascular:  Negative for chest pain.  ?Gastrointestinal:  Positive for abdominal pain, nausea and vomiting. Negative for blood in stool, constipation and diarrhea.  ?Genitourinary:  Negative for dysuria, frequency, pelvic pain, urgency, vaginal bleeding, vaginal discharge and vaginal pain.  ?Neurological:  Negative for dizziness, light-headedness and headaches.  ? ? ?Physical Exam ?Triage Vital Signs ?ED Triage Vitals  ?Enc Vitals Group  ?   BP 08/10/21 1859 (!) 157/88  ?   Pulse Rate 08/10/21 1859 96  ?   Resp 08/10/21 1859 18  ?   Temp 08/10/21 1859 99.4 ?F (37.4 ?C)  ?   Temp Source 08/10/21 1859 Oral  ?   SpO2 08/10/21 1859 94 %  ?   Weight --   ?   Height --   ?   Head Circumference --   ?   Peak Flow --   ?   Pain Score 08/10/21 1857 7  ?   Pain Loc --   ?   Pain Edu? --   ?   Excl. in GC? --   ? ?No data found. ? ?Updated Vital Signs ?BP (!) 157/88   Pulse 96   Temp 99.4 ?F (37.4 ?C) (Oral)   Resp 18   SpO2 94%  ? ?Visual Acuity ?Right Eye Distance:   ?Left Eye Distance:   ?Bilateral Distance:   ? ?Right Eye Near:   ?Left Eye Near:    ?Bilateral  Near:    ? ?Physical Exam ?Vitals reviewed.  ?Constitutional:   ?   General: She is awake. She is not in acute distress. ?   Appearance: Normal appearance. She is well-developed. She is not ill-appearing.  ?   Comments: Very pleasant female appears stated age in no acute distress sitting comfortably in exam room  ?HENT:  ?   Head: Normocephalic and atraumatic.  ?   Mouth/Throat:  ?   Mouth: Mucous membranes are moist.  ?Cardiovascular:  ?   Rate and Rhythm: Normal rate and regular rhythm.  ?  Heart sounds: Normal heart sounds, S1 normal and S2 normal. No murmur heard. ?Pulmonary:  ?   Effort: Pulmonary effort is normal.  ?   Breath sounds: Normal breath sounds. No wheezing, rhonchi or rales.  ?   Comments: Clear to auscultation bilaterally ?Abdominal:  ?   General: Bowel sounds are normal.  ?   Palpations: Abdomen is soft.  ?   Tenderness: There is no abdominal tenderness. There is no right CVA tenderness, left CVA tenderness, guarding or rebound.  ?   Comments: Benign abdominal exam.  No significant tenderness palpation.  No evidence of acute abdomen on physical exam.  ?Psychiatric:     ?   Behavior: Behavior is cooperative.  ? ? ? ?UC Treatments / Results  ?Labs ?(all labs ordered are listed, but only abnormal results are displayed) ?Labs Reviewed  ?POCT URINALYSIS DIPSTICK, ED / UC - Abnormal; Notable for the following components:  ?    Result Value  ? Hgb urine dipstick TRACE (*)   ? All other components within normal limits  ?CBG MONITORING, ED - Abnormal; Notable for the following components:  ? Glucose-Capillary 129 (*)   ? All other components within normal limits  ?CBC WITH DIFFERENTIAL/PLATELET  ?COMPREHENSIVE METABOLIC PANEL  ?LIPASE, BLOOD  ?POC URINE PREG, ED  ? ? ?EKG ? ? ?Radiology ?No results found. ? ?Procedures ?Procedures (including critical care time) ? ?Medications Ordered in UC ?Medications  ?ondansetron (ZOFRAN-ODT) disintegrating tablet 4 mg (4 mg Oral Given 08/10/21 1931)  ? ? ?Initial  Impression / Assessment and Plan / UC Course  ?I have reviewed the triage vital signs and the nursing notes. ? ?Pertinent labs & imaging results that were available during my care of the patient were reviewed by me an

## 2021-08-10 NOTE — ED Triage Notes (Signed)
Pt is present today with v/o vomiting and abdominal pain. Pt states sx started yesterday  ?

## 2021-08-11 ENCOUNTER — Telehealth (HOSPITAL_COMMUNITY): Payer: Self-pay | Admitting: Internal Medicine

## 2021-08-11 ENCOUNTER — Telehealth (HOSPITAL_COMMUNITY): Payer: Self-pay | Admitting: Emergency Medicine

## 2021-08-11 ENCOUNTER — Ambulatory Visit (HOSPITAL_COMMUNITY)
Admission: EM | Admit: 2021-08-11 | Discharge: 2021-08-11 | Disposition: A | Discharge status: Home / Self Care | Payer: BC Managed Care – PPO | Attending: Internal Medicine | Admitting: Internal Medicine

## 2021-08-11 DIAGNOSIS — Z09 Encounter for follow-up examination after completed treatment for conditions other than malignant neoplasm: Secondary | ICD-10-CM

## 2021-08-11 LAB — COMPREHENSIVE METABOLIC PANEL
ALT: 16 U/L (ref 0–44)
AST: 18 U/L (ref 15–41)
Albumin: 3.5 g/dL (ref 3.5–5.0)
Alkaline Phosphatase: 98 U/L (ref 38–126)
Anion gap: 9 (ref 5–15)
BUN: 14 mg/dL (ref 6–20)
CO2: 30 mmol/L (ref 22–32)
Calcium: 9 mg/dL (ref 8.9–10.3)
Chloride: 106 mmol/L (ref 98–111)
Creatinine, Ser: 1.43 mg/dL — ABNORMAL HIGH (ref 0.44–1.00)
GFR, Estimated: 45 mL/min — ABNORMAL LOW (ref >60)
Glucose, Bld: 92 mg/dL (ref 70–99)
Potassium: 3.1 mmol/L — ABNORMAL LOW (ref 3.5–5.1)
Sodium: 145 mmol/L (ref 135–145)
Total Bilirubin: 0.7 mg/dL (ref 0.3–1.2)
Total Protein: 6.4 g/dL — ABNORMAL LOW (ref 6.5–8.1)

## 2021-08-11 LAB — CBC WITH DIFFERENTIAL/PLATELET
Abs Immature Granulocytes: 0.09 10*3/uL — ABNORMAL HIGH (ref 0.00–0.07)
Basophils Absolute: 0 10*3/uL (ref 0.0–0.1)
Basophils Relative: 0 %
Eosinophils Absolute: 0.1 10*3/uL (ref 0.0–0.5)
Eosinophils Relative: 1 %
HCT: 41.3 % (ref 36.0–46.0)
Hemoglobin: 12.9 g/dL (ref 12.0–15.0)
Immature Granulocytes: 1 %
Lymphocytes Relative: 15 %
Lymphs Abs: 2.1 10*3/uL (ref 0.7–4.0)
MCH: 22.7 pg — ABNORMAL LOW (ref 26.0–34.0)
MCHC: 31.2 g/dL (ref 30.0–36.0)
MCV: 72.6 fL — ABNORMAL LOW (ref 80.0–100.0)
Monocytes Absolute: 0.9 10*3/uL (ref 0.1–1.0)
Monocytes Relative: 6 %
Neutro Abs: 11.3 10*3/uL — ABNORMAL HIGH (ref 1.7–7.7)
Neutrophils Relative %: 77 %
Platelets: 247 10*3/uL (ref 150–400)
RBC: 5.69 MIL/uL — ABNORMAL HIGH (ref 3.87–5.11)
RDW: 16.3 % — ABNORMAL HIGH (ref 11.5–15.5)
WBC: 14.5 10*3/uL — ABNORMAL HIGH (ref 4.0–10.5)
nRBC: 0 % (ref 0.0–0.2)

## 2021-08-11 NOTE — Telephone Encounter (Signed)
Per Junie Panning, APP patient to return for nurse visit for STAT labs (CBC, CMP) to re-evaluate kidney function that was altered, and low potassium.  Patient verbalized understanding ?

## 2021-08-11 NOTE — ED Triage Notes (Signed)
Pt presents for recheck on blood work.  ?

## 2021-08-11 NOTE — Telephone Encounter (Signed)
CMP ordered. CBC not indicated given benign physical exam and vitals ?

## 2022-01-12 ENCOUNTER — Other Ambulatory Visit: Payer: Self-pay | Admitting: Internal Medicine

## 2022-01-12 DIAGNOSIS — Z1231 Encounter for screening mammogram for malignant neoplasm of breast: Secondary | ICD-10-CM

## 2022-01-23 ENCOUNTER — Ambulatory Visit
Admission: EM | Admit: 2022-01-23 | Discharge: 2022-01-23 | Disposition: A | Discharge status: Home / Self Care | Payer: BC Managed Care – PPO

## 2022-01-23 DIAGNOSIS — M545 Low back pain, unspecified: Secondary | ICD-10-CM | POA: Diagnosis not present

## 2022-01-23 DIAGNOSIS — M62838 Other muscle spasm: Secondary | ICD-10-CM | POA: Diagnosis not present

## 2022-01-23 MED ORDER — IBUPROFEN 800 MG PO TABS
800.0000 mg | ORAL_TABLET | Freq: Three times a day (TID) | ORAL | 0 refills | Status: DC
Start: 2022-01-23 — End: 2022-05-24

## 2022-01-23 MED ORDER — TIZANIDINE HCL 4 MG PO TABS: 4.0000 mg | ORAL_TABLET | Freq: Four times a day (QID) | ORAL | 0 refills | Status: DC | PRN

## 2022-01-23 NOTE — Discharge Instructions (Addendum)
Advised to use ice therapy, 10 minutes on 20 minutes off, 3-4 times throughout the day and of the evening to help reduce pain and inflammation. Advised take ibuprofen 100 mg every 8 hours with food to help reduce pain and inflammation. Has take the Zanaflex 4 mg every 6 hours to help reduce muscle spasm and irritability. Advised to follow-up PCP or return to urgent care if symptoms fail to improve.

## 2022-01-23 NOTE — ED Triage Notes (Signed)
Pt presents to uc with co of back pain.  Pt reports she thinks she pulled a muscle.  Pt reports pain is 10/10 and she has taken tylenol/motrin dual action and heatting pads.  Pt needs work note for today.  Pain is located in lower back

## 2022-01-23 NOTE — ED Provider Notes (Signed)
[name]    CSN: [id number] Arrival date & time: 01/23/22  1445      History   Chief Complaint Chief Complaint  Patient presents with   Back Pain    HPI Jaime Daugherty is a 51 y.o. female.   51 year old female presents with lower back pain.  Patient indicates that she works for [employer] and does a lot of lifting repetitively all day.  She indicates that yesterday she started having lower back pain which has been localized but progressively worse over the past 24 hours.  She indicates the pain intensifies when she bends, turns, and tries to get up from sitting.  She indicates she has been taking some ibuprofen OTC which has given her minimal relief along with using heat.  She indicates that she is not having any urinary symptoms such as frequency, urgency, or dysuria.  She also relates that she is not having any numbness, tingling, weakness of the lower extremities.  Patient is concerned about the degree of pain and that she also needs a note for work since she took today off to try to treat her discomfort.   Back Pain   Past Medical History:  Diagnosis Date   Chest pain of uncertain etiology 08/22/2018   Diabetes mellitus without complication (HCC)    Hypertension     Patient Active Problem List   Diagnosis Date Noted   Influenza A 03/31/2021   Acute respiratory failure with hypoxia (HCC) 03/31/2021   Acute diarrhea 03/31/2021   Hypokalemia due to excessive gastrointestinal loss of potassium 03/31/2021   Mixed hyperlipidemia due to type 2 diabetes mellitus (HCC) 03/31/2021   GERD without esophagitis 03/31/2021   Lactic acidosis 03/31/2021   Elevated brain natriuretic peptide (BNP) level 03/31/2021   Sepsis, viral (HCC) 03/31/2021   Uncontrolled type 2 diabetes mellitus with hyperglycemia (HCC) 08/23/2018   Morbid obesity (HCC) 08/23/2018   Chest pain of uncertain etiology 08/22/2018   Type 2 diabetes mellitus with hyperglycemia, with long-term current use  of insulin (HCC) 08/26/2013   Essential hypertension 06/25/2011    Past Surgical History:  Procedure Laterality Date   ABDOMINAL HYSTERECTOMY     CHOLECYSTECTOMY      OB History   No obstetric history on file.      Home Medications    Prior to Admission medications   Medication Sig Start Date End Date Taking? Authorizing Provider  ibuprofen (ADVIL) 800 MG tablet Take 1 tablet (800 mg total) by mouth 3 (three) times daily. 01/23/22  Yes Ellsworth Lennox, PA-C  insulin glargine (LANTUS SOLOSTAR) 100 UNIT/ML Solostar Pen 40 units Subcutaneous Once a day 01/04/17  Yes [provider]  lisinopril-hydrochlorothiazide (ZESTORETIC) 20-12.5 MG tablet 1 tablet Orally Once a day for 90 days 11/24/21  Yes [provider]  tiZANidine (ZANAFLEX) 4 MG tablet Take 1 tablet (4 mg total) by mouth every 6 (six) hours as needed for muscle spasms. 01/23/22  Yes Ellsworth Lennox, PA-C  acetaminophen (TYLENOL) 500 MG tablet Take 500 mg by mouth every 6 (six) hours as needed for headache or mild pain.    [provider]  albuterol (PROVENTIL HFA;VENTOLIN HFA) 108 (90 Base) MCG/ACT inhaler Inhale 2 puffs into the lungs every 6 (six) hours as needed for wheezing or shortness of breath. 02/06/18   Elvina Sidle, MD  benzonatate (TESSALON PERLES) 100 MG capsule Take 1 capsule (100 mg total) by mouth every 6 (six) hours as needed for cough. Patient not taking: Reported on 01/23/2022 04/03/21  04/03/22  Alwyn Ren, MD  brompheniramine-pseudoephedrine-DM 30-2-10 MG/5ML syrup Take 10 mLs by mouth every 4 (four) hours as needed. Patient not taking: Reported on 01/23/2022 03/30/21   [provider]  empagliflozin (JARDIANCE) 25 MG TABS tablet Take 1 tablet by mouth daily.    [provider]  furosemide (LASIX) 40 MG tablet Take 40 mg by mouth every other day. 11/02/17   [provider]  Insulin Glargine (LANTUS SOLOSTAR) 100 UNIT/ML Solostar Pen Inject 30 Units into the  skin daily. Patient taking differently: Inject 60 Units into the skin daily. 01/04/17   Romero Belling, MD  lisinopril-hydrochlorothiazide (ZESTORETIC) 20-12.5 MG tablet 1 tablet    [provider]  ondansetron (ZOFRAN-ODT) 4 MG disintegrating tablet Take 1 tablet (4 mg total) by mouth every 8 (eight) hours as needed for nausea or vomiting. Patient not taking: Reported on 01/23/2022 08/10/21   Raspet, Noberto Retort, PA-C  oseltamivir (TAMIFLU) 75 MG capsule Take 1 capsule (75 mg total) by mouth 2 (two) times daily. Patient not taking: Reported on 01/23/2022 04/03/21   Alwyn Ren, MD  rosuvastatin (CRESTOR) 20 MG tablet Take 1 tablet (20 mg total) by mouth at bedtime. 08/22/18 08/22/19  Patwardhan, Anabel Bene, MD  Semaglutide-Weight Management (WEGOVY) 1.7 MG/0.75ML SOAJ 1.7 mg Subcutaneous Once a week    [provider]    Family History Family History  Problem Relation Age of Onset   Hypertension Mother    Hypertension Sister    Diabetes Paternal Grandmother    Breast cancer Maternal Aunt     Social History Social History   Tobacco Use   Smoking status: Former    Packs/day: 0.50    Types: Cigarettes    Quit date: 12/2019    Years since quitting: 2.1   Smokeless tobacco: Never  Vaping Use   Vaping Use: Never used  Substance Use Topics   Alcohol use: Yes    Alcohol/week: 0.0 standard drinks of alcohol    Comment: socially   Drug use: No     Allergies   Patient has no known allergies.   Review of Systems Review of Systems  Musculoskeletal:  Positive for back pain (lower midline).     Physical Exam Triage Vital Signs ED Triage Vitals  Enc Vitals Group     BP 01/23/22 1610 (!) 168/94     Pulse Rate 01/23/22 1610 87     Resp 01/23/22 1610 19     Temp 01/23/22 1610 98.2 F (36.8 C)     Temp src --      SpO2 01/23/22 1610 98 %     Weight --      Height --      Head Circumference --      Peak Flow --      Pain Score 01/23/22 1607 10     Pain Loc  --      Pain Edu? --      Excl. in GC? --    No data found.  Updated Vital Signs BP (!) 168/94   Pulse 87   Temp 98.2 F (36.8 C)   Resp 19   SpO2 98%   Visual Acuity Right Eye Distance:   Left Eye Distance:   Bilateral Distance:    Right Eye Near:   Left Eye Near:    Bilateral Near:     Physical Exam Constitutional:      Appearance: Normal appearance.  Musculoskeletal:       Back:  Comments: Back: Pain is palpated midline at L1-2 L5, without any unusual redness or swelling.  Range of motion is limited with pain increasing on bending and turning. Negative straight leg raise bilaterally, strength is intact bilaterally  Neurological:     Mental Status: She is alert.      UC Treatments / Results  Labs (all labs ordered are listed, but only abnormal results are displayed) Labs Reviewed - No data to display  EKG   Radiology No results found.  Procedures Procedures (including critical care time)  Medications Ordered in UC Medications - No data to display  Initial Impression / Assessment and Plan / UC Course  I have reviewed the triage vital signs and the nursing notes.  Pertinent labs & imaging results that were available during my care of the patient were reviewed by me and considered in my medical decision making (see chart for details).    Plan: 1.  The acute lower back pain will be treated with the following: A. Ibuprofen 800 mg every 8 hours with food to help decrease pain and discomfort. 2.  The acute muscle spasm will be treated with the following: A.  Zanaflex 4 mg every 6 hours on a regular basis to help reduce muscle spasm and irritability. C.  Ice therapy, 10 minutes on 20 minutes off, 3-4 times a day over the next several days to help decrease inflammation. B.  Advised to follow-up PCP or return to urgent care if symptoms fail to improve.  Final Clinical Impressions(s) / UC Diagnoses   Final diagnoses:  Acute midline low back pain  without sciatica  Muscle spasm     Discharge Instructions      Advised to use ice therapy, 10 minutes on 20 minutes off, 3-4 times throughout the day and of the evening to help reduce pain and inflammation. Advised take ibuprofen 100 mg every 8 hours with food to help reduce pain and inflammation. Has take the Zanaflex 4 mg every 6 hours to help reduce muscle spasm and irritability. Advised to follow-up PCP or return to urgent care if symptoms fail to improve.    ED Prescriptions     Medication Sig Dispense Auth. Provider   ibuprofen (ADVIL) 800 MG tablet Take 1 tablet (800 mg total) by mouth 3 (three) times daily. 21 tablet Nyoka Lint, PA-C   tiZANidine (ZANAFLEX) 4 MG tablet Take 1 tablet (4 mg total) by mouth every 6 (six) hours as needed for muscle spasms. 30 tablet Nyoka Lint, PA-C      PDMP not reviewed this encounter.   Nyoka Lint, PA-C 01/23/22 1634

## 2022-02-06 IMAGING — DX DG CHEST 1V
1 series · 1 of 1 positions shown · non-contrast
Comparison: 03/31/2021

CLINICAL DATA: Dyspnea

EXAM:
CHEST  1 VIEW

[chest ap]
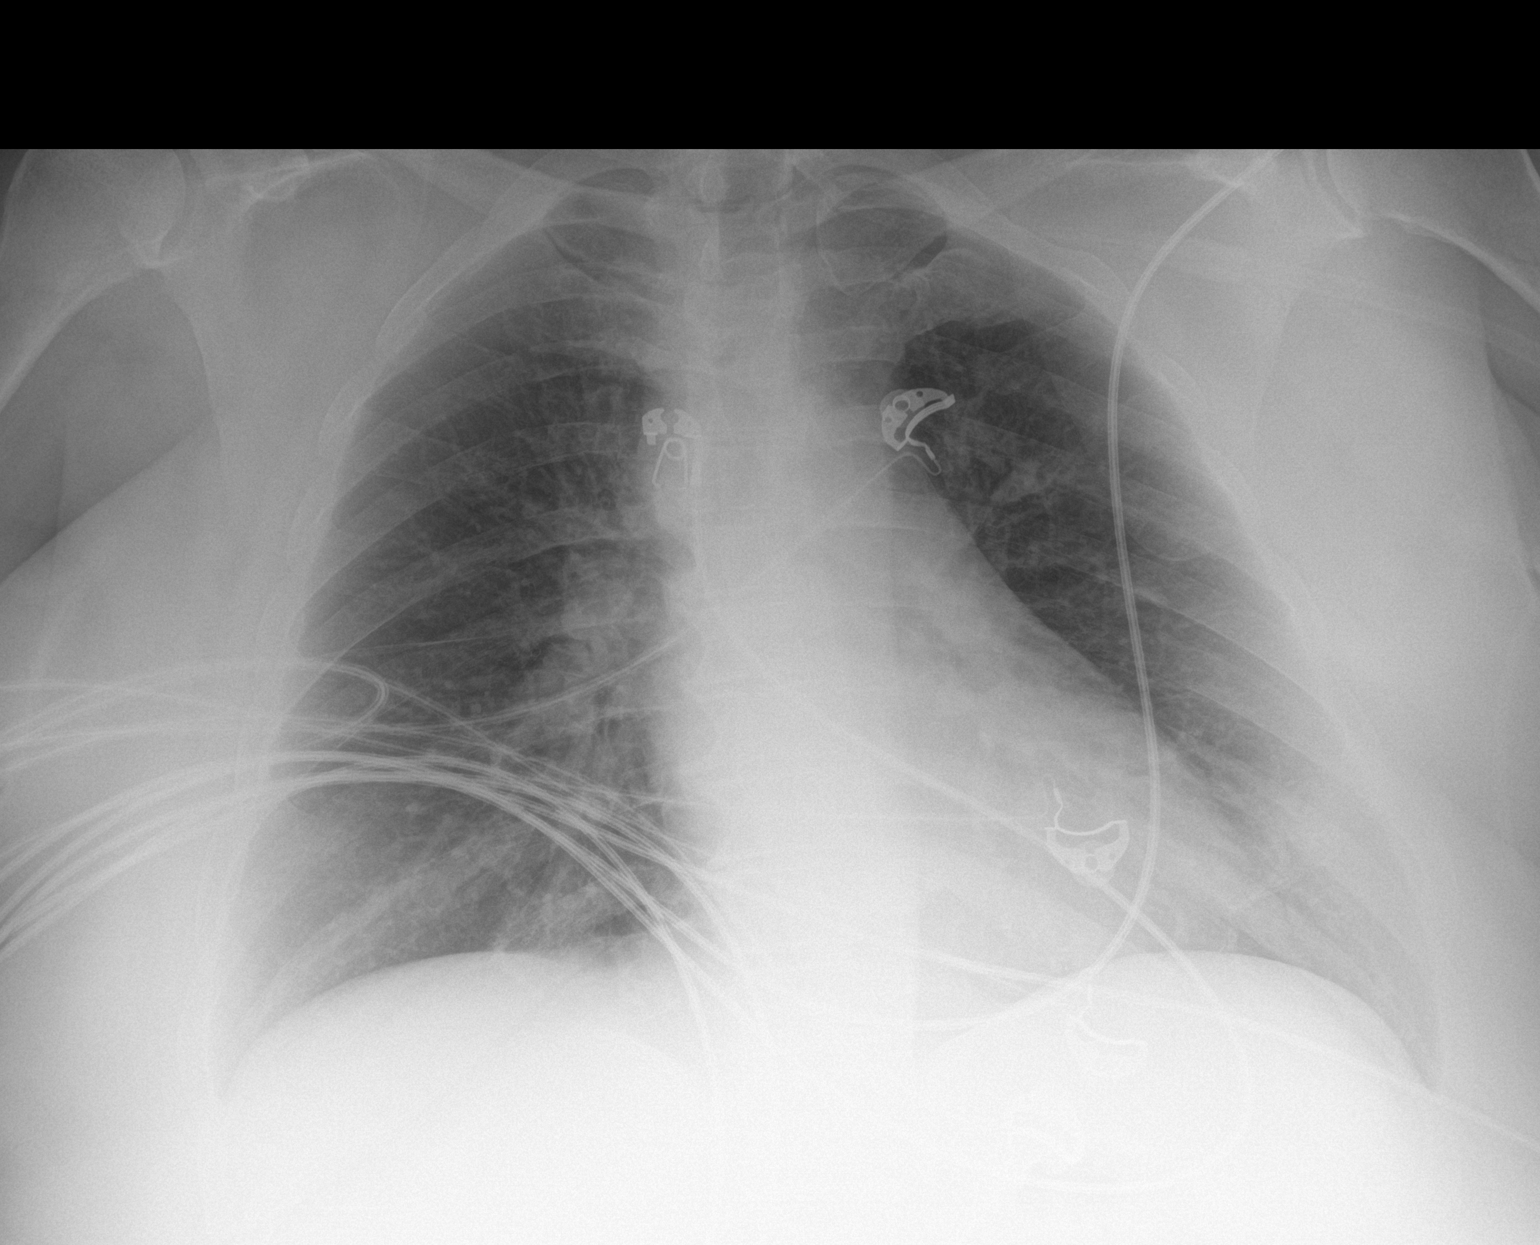

[1 of 1 positions shown; findings below may reference images not displayed]

FINDINGS: The heart size and mediastinal contours are within normal limits.
Both lungs are clear. The visualized skeletal structures are
unremarkable.
IMPRESSION: No active disease.

## 2022-02-21 ENCOUNTER — Ambulatory Visit
Admission: RE | Admit: 2022-02-21 | Discharge: 2022-02-21 | Disposition: A | Discharge status: Home / Self Care | Payer: BC Managed Care – PPO | Source: Ambulatory Visit | Attending: Internal Medicine | Admitting: Internal Medicine

## 2022-02-21 DIAGNOSIS — Z1231 Encounter for screening mammogram for malignant neoplasm of breast: Secondary | ICD-10-CM

## 2022-03-06 ENCOUNTER — Ambulatory Visit
Admission: EM | Admit: 2022-03-06 | Discharge: 2022-03-06 | Disposition: A | Discharge status: Home / Self Care | Payer: BC Managed Care – PPO | Attending: Physician Assistant | Admitting: Physician Assistant

## 2022-03-06 DIAGNOSIS — J069 Acute upper respiratory infection, unspecified: Secondary | ICD-10-CM | POA: Diagnosis present

## 2022-03-06 DIAGNOSIS — Z1152 Encounter for screening for COVID-19: Secondary | ICD-10-CM | POA: Insufficient documentation

## 2022-03-06 NOTE — ED Triage Notes (Signed)
Pt presents with productive cough and chest congestion for past few days.

## 2022-03-06 NOTE — ED Provider Notes (Signed)
Jaime Daugherty    CSN: [id number] Arrival date & time: 03/06/22  1439      History   Chief Complaint Chief Complaint  Patient presents with   URI    HPI Jaime Daugherty is a 51 y.o. female.   Patient here today for evaluation of productive cough, chest congestion that she has had the last few days.  She denies any fever.  She has not had any vomiting or diarrhea.  She has tried over-the-counter medication without resolution.  The history is provided by the patient.  URI Presenting symptoms: congestion and cough   Presenting symptoms: no ear pain, no fever and no sore throat   Associated symptoms: no wheezing     Past Medical History:  Diagnosis Date   Chest pain of uncertain etiology [id number]   Diabetes mellitus without complication ([place])    Hypertension     Patient Active Problem List   Diagnosis Date Noted   Influenza A 03/31/2021   Acute respiratory failure with hypoxia ([place]) 03/31/2021   Acute diarrhea 03/31/2021   Hypokalemia due to excessive gastrointestinal loss of potassium 03/31/2021   Mixed hyperlipidemia due to type 2 diabetes mellitus ([place]) 03/31/2021   GERD without esophagitis 03/31/2021   Lactic acidosis 03/31/2021   Elevated brain natriuretic peptide (BNP) level 03/31/2021   Sepsis, viral ([place]) 03/31/2021   Uncontrolled type 2 diabetes mellitus with hyperglycemia ([place]) 08/23/2018   Morbid obesity ([place]) 08/23/2018   Chest pain of uncertain etiology [id number]   Type 2 diabetes mellitus with hyperglycemia, with long-term current use of insulin ([place]) 08/26/2013   Essential hypertension 06/25/2011    Past Surgical History:  Procedure Laterality Date   ABDOMINAL HYSTERECTOMY     CHOLECYSTECTOMY      OB History   No obstetric history on file.      Home Medications    Prior to Admission medications   Medication Sig Start Date End Date Taking? Authorizing Provider  acetaminophen (TYLENOL) 500 MG tablet Take 500 mg by mouth every  6 (six) hours as needed for headache or mild pain.    [provider]  albuterol (PROVENTIL HFA;VENTOLIN HFA) 108 (90 Base) MCG/ACT inhaler Inhale 2 puffs into the lungs every 6 (six) hours as needed for wheezing or shortness of breath. 02/06/18   Robyn Haber, MD  benzonatate (TESSALON PERLES) 100 MG capsule Take 1 capsule (100 mg total) by mouth every 6 (six) hours as needed for cough. Patient not taking: Reported on 01/23/2022 04/03/21 04/03/22  Georgette Shell, MD  brompheniramine-pseudoephedrine-DM 30-2-10 MG/5ML syrup Take 10 mLs by mouth every 4 (four) hours as needed. Patient not taking: Reported on 01/23/2022 03/30/21   [provider]  empagliflozin (JARDIANCE) 25 MG TABS tablet Take 1 tablet by mouth daily.    [provider]  furosemide (LASIX) 40 MG tablet Take 40 mg by mouth every other day. 11/02/17   [provider]  ibuprofen (ADVIL) 800 MG tablet Take 1 tablet (800 mg total) by mouth 3 (three) times daily. 01/23/22   Nyoka Lint, PA-C  Insulin Glargine (LANTUS SOLOSTAR) 100 UNIT/ML Solostar Pen Inject 30 Units into the skin daily. Patient taking differently: Inject 60 Units into the skin daily. 01/04/17   Renato Shin, MD  insulin glargine (LANTUS SOLOSTAR) 100 UNIT/ML Solostar Pen 40 units Subcutaneous Once a day 01/04/17   [provider]  lisinopril-hydrochlorothiazide (ZESTORETIC) 20-12.5 MG tablet 1 tablet    [provider]  lisinopril-hydrochlorothiazide (ZESTORETIC) 20-12.5 MG tablet 1  tablet Orally Once a day for 90 days 11/24/21   [provider]  ondansetron (ZOFRAN-ODT) 4 MG disintegrating tablet Take 1 tablet (4 mg total) by mouth every 8 (eight) hours as needed for nausea or vomiting. Patient not taking: Reported on 01/23/2022 08/10/21   Raspet, Noberto Retort, PA-C  oseltamivir (TAMIFLU) 75 MG capsule Take 1 capsule (75 mg total) by mouth 2 (two) times daily. Patient not taking: Reported on 01/23/2022 04/03/21    Alwyn Ren, MD  rosuvastatin (CRESTOR) 20 MG tablet Take 1 tablet (20 mg total) by mouth at bedtime. 08/22/18 08/22/19  Patwardhan, Anabel Bene, MD  Semaglutide-Weight Management (WEGOVY) 1.7 MG/0.75ML SOAJ 1.7 mg Subcutaneous Once a week    [provider]  tiZANidine (ZANAFLEX) 4 MG tablet Take 1 tablet (4 mg total) by mouth every 6 (six) hours as needed for muscle spasms. 01/23/22   Ellsworth Lennox, PA-C    Family History Family History  Problem Relation Age of Onset   Hypertension Mother    Hypertension Sister    Diabetes Paternal Grandmother    Breast cancer Maternal Aunt     Social History Social History   Tobacco Use   Smoking status: Former    Packs/day: 0.50    Types: Cigarettes    Quit date: 12/2019    Years since quitting: 2.2   Smokeless tobacco: Never  Vaping Use   Vaping Use: Never used  Substance Use Topics   Alcohol use: Yes    Alcohol/week: 0.0 standard drinks of alcohol    Comment: socially   Drug use: No     Allergies   Patient has no known allergies.   Review of Systems Review of Systems  Constitutional:  Negative for chills and fever.  HENT:  Positive for congestion. Negative for ear pain and sore throat.   Eyes:  Negative for discharge and redness.  Respiratory:  Positive for cough. Negative for shortness of breath and wheezing.   Gastrointestinal:  Negative for abdominal pain, diarrhea, nausea and vomiting.     Physical Exam Triage Vital Signs ED Triage Vitals  Enc Vitals Group     BP 03/06/22 1527 (!) 150/72     Pulse Rate 03/06/22 1527 93     Resp 03/06/22 1527 18     Temp 03/06/22 1527 98.6 F (37 C)     Temp Source 03/06/22 1527 Oral     SpO2 03/06/22 1527 94 %     Weight --      Height --      Head Circumference --      Peak Flow --      Pain Score 03/06/22 1529 2     Pain Loc --      Pain Edu? --      Excl. in GC? --    No data found.  Updated Vital Signs BP (!) 150/72 (BP Location: Left Arm)   Pulse 93    Temp 98.6 F (37 C) (Oral)   Resp 18   SpO2 94%       Physical Exam Vitals and nursing note reviewed.  Constitutional:      General: She is not in acute distress.    Appearance: Normal appearance. She is not ill-appearing.  HENT:     Head: Normocephalic and atraumatic.     Nose: Congestion present.     Mouth/Throat:     Mouth: Mucous membranes are moist.     Pharynx: No oropharyngeal exudate or posterior oropharyngeal erythema.  Eyes:     Conjunctiva/sclera: Conjunctivae normal.  Cardiovascular:     Rate and Rhythm: Normal rate and regular rhythm.     Heart sounds: Normal heart sounds. No murmur heard. Pulmonary:     Effort: Pulmonary effort is normal. No respiratory distress.     Breath sounds: Normal breath sounds. No wheezing, rhonchi or rales.  Skin:    General: Skin is warm and dry.  Neurological:     Mental Status: She is alert.  Psychiatric:        Mood and Affect: Mood normal.        Thought Content: Thought content normal.      UC Treatments / Results  Labs (all labs ordered are listed, but only abnormal results are displayed) Labs Reviewed  SARS CORONAVIRUS 2 (TAT 6-24 HRS)    EKG   Radiology No results found.  Procedures Procedures (including critical Daugherty time)  Medications Ordered in UC Medications - No data to display  Initial Impression / Assessment and Plan / UC Course  I have reviewed the triage vital signs and the nursing notes.  Pertinent labs & imaging results that were available during my Daugherty of the patient were reviewed by me and considered in my medical decision making (see chart for details).    Suspect viral etiology of symptoms.  Will order screening for COVID.  We will results further recommendation but encouraged symptomatic treatment in the meantime.  Recommend follow-up with any worsening symptoms or further concerns.  Final Clinical Impressions(s) / UC Diagnoses   Final diagnoses:  Acute upper respiratory infection   Encounter for screening for COVID-19   Discharge Instructions   None    ED Prescriptions   None    PDMP not reviewed this encounter.   Francene Finders, PA-C 03/06/22 1839

## 2022-03-07 LAB — SARS CORONAVIRUS 2 (TAT 6-24 HRS): SARS Coronavirus 2: NEGATIVE

## 2022-05-24 ENCOUNTER — Ambulatory Visit
Admission: EM | Admit: 2022-05-24 | Discharge: 2022-05-24 | Disposition: A | Discharge status: Home / Self Care | Payer: BC Managed Care – PPO | Attending: Family Medicine | Admitting: Family Medicine

## 2022-05-24 DIAGNOSIS — L239 Allergic contact dermatitis, unspecified cause: Secondary | ICD-10-CM

## 2022-05-24 MED ORDER — TRIAMCINOLONE ACETONIDE 0.1 % EX CREA: 1.0000 | TOPICAL_CREAM | Freq: Two times a day (BID) | CUTANEOUS | 0 refills | Status: DC

## 2022-05-24 NOTE — ED Triage Notes (Signed)
Pt presents with rash on back X 3 weeks.

## 2022-05-24 NOTE — ED Provider Notes (Signed)
[name]    CSN: [id number] Arrival date & time: 05/24/22  1501      History   Chief Complaint Chief Complaint  Patient presents with   Rash    HPI Jaime Daugherty is a 52 y.o. female.    Rash  Here for a pruritic rash that has been there over a week.  She had it there previously and then it got better and then it is come back.  It is mainly in her mid back under her bra strap.  No fever or cough or shortness of breath.  She has diabetes that is very well-controlled.  Past Medical History:  Diagnosis Date   Chest pain of uncertain etiology [id number]   Diabetes mellitus without complication ([place])    Hypertension     Patient Active Problem List   Diagnosis Date Noted   Influenza A 03/31/2021   Acute respiratory failure with hypoxia ([place]) 03/31/2021   Acute diarrhea 03/31/2021   Hypokalemia due to excessive gastrointestinal loss of potassium 03/31/2021   Mixed hyperlipidemia due to type 2 diabetes mellitus ([place]) 03/31/2021   GERD without esophagitis 03/31/2021   Lactic acidosis 03/31/2021   Elevated brain natriuretic peptide (BNP) level 03/31/2021   Sepsis, viral ([place]) 03/31/2021   Uncontrolled type 2 diabetes mellitus with hyperglycemia ([place]) 08/23/2018   Morbid obesity ([place]) 08/23/2018   Chest pain of uncertain etiology [id number]   Type 2 diabetes mellitus with hyperglycemia, with long-term current use of insulin ([place]) 08/26/2013   Essential hypertension 06/25/2011    Past Surgical History:  Procedure Laterality Date   ABDOMINAL HYSTERECTOMY     CHOLECYSTECTOMY      OB History   No obstetric history on file.      Home Medications    Prior to Admission medications   Medication Sig Start Date End Date Taking? Authorizing Provider  triamcinolone cream (KENALOG) 0.1 % Apply 1 Application topically 2 (two) times daily. To affected area till better 05/24/22  Yes Lamontae Ricardo, Gwenlyn Perking, MD  acetaminophen (TYLENOL) 500 MG tablet Take 500 mg by  mouth every 6 (six) hours as needed for headache or mild pain.    [provider]  albuterol (PROVENTIL HFA;VENTOLIN HFA) 108 (90 Base) MCG/ACT inhaler Inhale 2 puffs into the lungs every 6 (six) hours as needed for wheezing or shortness of breath. 02/06/18   Robyn Haber, MD  empagliflozin (JARDIANCE) 25 MG TABS tablet Take 1 tablet by mouth daily.    [provider]  furosemide (LASIX) 40 MG tablet Take 40 mg by mouth every other day. 11/02/17   [provider]  Insulin Glargine (LANTUS SOLOSTAR) 100 UNIT/ML Solostar Pen Inject 30 Units into the skin daily. Patient taking differently: Inject 60 Units into the skin daily. 01/04/17   Renato Shin, MD  insulin glargine (LANTUS SOLOSTAR) 100 UNIT/ML Solostar Pen 40 units Subcutaneous Once a day 01/04/17   [provider]  lisinopril-hydrochlorothiazide (ZESTORETIC) 20-12.5 MG tablet 1 tablet    [provider]  lisinopril-hydrochlorothiazide (ZESTORETIC) 20-12.5 MG tablet 1 tablet Orally Once a day for 90 days 11/24/21   [provider]  rosuvastatin (CRESTOR) 20 MG tablet Take 1 tablet (20 mg total) by mouth at bedtime. 08/22/18 08/22/19  Patwardhan, Reynold Bowen, MD  Semaglutide-Weight Management (WEGOVY) 1.7 MG/0.75ML SOAJ 1.7 mg Subcutaneous Once a week    [provider]    Family History Family History  Problem Relation Age of Onset   Hypertension Mother    Hypertension Sister  Diabetes Paternal Grandmother    Breast cancer Maternal Aunt     Social History Social History   Tobacco Use   Smoking status: Former    Packs/day: 0.50    Types: Cigarettes    Quit date: 12/2019    Years since quitting: 2.4   Smokeless tobacco: Never  Vaping Use   Vaping Use: Never used  Substance Use Topics   Alcohol use: Yes    Alcohol/week: 0.0 standard drinks of alcohol    Comment: socially   Drug use: No     Allergies   Patient has no known allergies.   Review of Systems Review  of Systems  Skin:  Positive for rash.     Physical Exam Triage Vital Signs ED Triage Vitals  Enc Vitals Group     BP 05/24/22 1515 131/77     Pulse Rate 05/24/22 1515 (!) 103     Resp 05/24/22 1515 18     Temp 05/24/22 1515 98.2 F (36.8 C)     Temp Source 05/24/22 1515 Oral     SpO2 05/24/22 1515 97 %     Weight --      Height --      Head Circumference --      Peak Flow --      Pain Score 05/24/22 1516 0     Pain Loc --      Pain Edu? --      Excl. in [place]? --    No data found.  Updated Vital Signs BP 131/77 (BP Location: Left Arm)   Pulse (!) 103   Temp 98.2 F (36.8 C) (Oral)   Resp 18   SpO2 97%   Visual Acuity Right Eye Distance:   Left Eye Distance:   Bilateral Distance:    Right Eye Near:   Left Eye Near:    Bilateral Near:     Physical Exam Vitals reviewed.  Constitutional:      General: She is not in acute distress.    Appearance: She is not ill-appearing, toxic-appearing or diaphoretic.  HENT:     Nose: Nose normal.     Mouth/Throat:     Mouth: Mucous membranes are moist.     Pharynx: No oropharyngeal exudate or posterior oropharyngeal erythema.  Eyes:     Extraocular Movements: Extraocular movements intact.     Conjunctiva/sclera: Conjunctivae normal.     Pupils: Pupils are equal, round, and reactive to light.  Cardiovascular:     Rate and Rhythm: Normal rate and regular rhythm.     Heart sounds: No murmur heard. Pulmonary:     Effort: Pulmonary effort is normal.     Breath sounds: Normal breath sounds.  Musculoskeletal:     Cervical back: Neck supple.  Lymphadenopathy:     Cervical: No cervical adenopathy.  Skin:    Coloration: Skin is not jaundiced or pale.     Comments: There is a papular rash in her midline and thoracic area under her bra off.  No purulence and no drainage.  No ulcerations and no vesicular rash.  Neurological:     General: No focal deficit present.     Mental Status: She is alert and oriented to person, place, and  time.  Psychiatric:        Behavior: Behavior normal.      UC Treatments / Results  Labs (all labs ordered are listed, but only abnormal results are displayed) Labs Reviewed - No data to display  EKG  Radiology No results found.  Procedures Procedures (including critical care time)  Medications Ordered in UC Medications - No data to display  Initial Impression / Assessment and Plan / UC Course  I have reviewed the triage vital signs and the nursing notes.  Pertinent labs & imaging results that were available during my care of the patient were reviewed by me and considered in my medical decision making (see chart for details).        We discussed options for treatment.  She prefers to have a topical cream so that she does not have to take prednisone.  She does have someone at home that can apply the cream for her.  She will follow-up with PCP if not improving completely or if it returns. Final Clinical Impressions(s) / UC Diagnoses   Final diagnoses:  Allergic contact dermatitis, unspecified trigger     Discharge Instructions      Put triamcinolone cream on the rash area 2 times daily until improved.  Follow-up with your primary care about this issue   You can take Benadryl as needed for itching; or zyrtec/Allegra/Claritin are less likely to make you sleepy, and they can help for itching     ED Prescriptions     Medication Sig Dispense Auth. Provider   triamcinolone cream (KENALOG) 0.1 % Apply 1 Application topically 2 (two) times daily. To affected area till better 80 g Windy Carina Gwenlyn Perking, MD      PDMP not reviewed this encounter.   Barrett Henle, MD 05/24/22 [phone]

## 2022-05-24 NOTE — Discharge Instructions (Signed)
Put triamcinolone cream on the rash area 2 times daily until improved.  Follow-up with your primary care about this issue   You can take Benadryl as needed for itching; or zyrtec/Allegra/Claritin are less likely to make you sleepy, and they can help for itching

## 2022-10-03 ENCOUNTER — Ambulatory Visit
Admission: EM | Admit: 2022-10-03 | Discharge: 2022-10-03 | Disposition: A | Discharge status: Home / Self Care | Payer: BC Managed Care – PPO | Attending: Internal Medicine | Admitting: Internal Medicine

## 2022-10-03 DIAGNOSIS — S161XXA Strain of muscle, fascia and tendon at neck level, initial encounter: Secondary | ICD-10-CM

## 2022-10-03 MED ORDER — METHOCARBAMOL 500 MG PO TABS: 500.0000 mg | ORAL_TABLET | Freq: Two times a day (BID) | ORAL | 0 refills | Status: DC | PRN

## 2022-10-03 NOTE — ED Triage Notes (Signed)
Pt c/o rt side of neck stiffness after waking up on Sunday morning. States tried bio freeze, heating pain, and advil with no relief. Denies injury.

## 2022-10-03 NOTE — Discharge Instructions (Addendum)
It appears that have been neck muscle strain.  I have prescribed a muscle relaxer to take as needed.  Please be advised that it can make you drowsy so no driving or drinking alcohol with it.  Follow-up if any symptoms persist or worsen.

## 2022-10-03 NOTE — ED Provider Notes (Signed)
Jaime Daugherty    CSN: [id number] Arrival date & time: 10/03/22  1610      History   Chief Complaint Chief Complaint  Patient presents with   Torticollis    HPI Jaime Daugherty is a 52 y.o. female.   Patient presents with right-sided neck stiffness and pain that started upon awakening about 4 days ago.  Patient reports that she has applied Biofreeze, used a heating pad, and taken Advil with no improvement.  Denies any obvious injury.  Denies numbness or tingling.  Patient is able to rotate neck.     Past Medical History:  Diagnosis Date   Chest pain of uncertain etiology 08/22/2018   Diabetes mellitus without complication ([facility])    Hypertension     Patient Active Problem List   Diagnosis Date Noted   Influenza A 03/31/2021   Acute respiratory failure with hypoxia ([facility]) 03/31/2021   Acute diarrhea 03/31/2021   Hypokalemia due to excessive gastrointestinal loss of potassium 03/31/2021   Mixed hyperlipidemia due to type 2 diabetes mellitus ([facility]) 03/31/2021   GERD without esophagitis 03/31/2021   Lactic acidosis 03/31/2021   Elevated brain natriuretic peptide (BNP) level 03/31/2021   Sepsis, viral ([facility]) 03/31/2021   Uncontrolled type 2 diabetes mellitus with hyperglycemia ([facility]) 08/23/2018   Morbid obesity ([facility]) 08/23/2018   Chest pain of uncertain etiology 08/22/2018   Type 2 diabetes mellitus with hyperglycemia, with long-term current use of insulin ([facility]) 08/26/2013   Essential hypertension 06/25/2011    Past Surgical History:  Procedure Laterality Date   ABDOMINAL HYSTERECTOMY     CHOLECYSTECTOMY      OB History   No obstetric history on file.      Home Medications    Prior to Admission medications   Medication Sig Start Date End Date Taking? Authorizing Provider  methocarbamol (ROBAXIN) 500 MG tablet Take 1 tablet (500 mg total) by mouth 2 (two) times daily as needed for muscle spasms. 10/03/22  Yes Trayvon Trumbull, Rolly Salter E, FNP  acetaminophen (TYLENOL)  500 MG tablet Take 500 mg by mouth every 6 (six) hours as needed for headache or mild pain.    [provider]  albuterol (PROVENTIL HFA;VENTOLIN HFA) 108 (90 Base) MCG/ACT inhaler Inhale 2 puffs into the lungs every 6 (six) hours as needed for wheezing or shortness of breath. 02/06/18   Elvina Sidle, MD  empagliflozin (JARDIANCE) 25 MG TABS tablet Take 1 tablet by mouth daily.    [provider]  furosemide (LASIX) 40 MG tablet Take 40 mg by mouth every other day. 11/02/17   [provider]  Insulin Glargine (LANTUS SOLOSTAR) 100 UNIT/ML Solostar Pen Inject 30 Units into the skin daily. Patient taking differently: Inject 60 Units into the skin daily. 01/04/17   Romero Belling, MD  insulin glargine (LANTUS SOLOSTAR) 100 UNIT/ML Solostar Pen 40 units Subcutaneous Once a day 01/04/17   [provider]  lisinopril-hydrochlorothiazide (ZESTORETIC) 20-12.5 MG tablet 1 tablet    [provider]  lisinopril-hydrochlorothiazide (ZESTORETIC) 20-12.5 MG tablet 1 tablet Orally Once a day for 90 days 11/24/21   [provider]  rosuvastatin (CRESTOR) 20 MG tablet Take 1 tablet (20 mg total) by mouth at bedtime. 08/22/18 08/22/19  Patwardhan, Anabel Bene, MD  Semaglutide-Weight Management (WEGOVY) 1.7 MG/0.75ML SOAJ 1.7 mg Subcutaneous Once a week    [provider]  triamcinolone cream (KENALOG) 0.1 % Apply 1 Application topically 2 (two) times daily. To affected area till better 05/24/22   Zenia Resides,  MD    Family History Family History  Problem Relation Age of Onset   Hypertension Mother    Hypertension Sister    Diabetes Paternal Grandmother    Breast cancer Maternal Aunt     Social History Social History   Tobacco Use   Smoking status: Former    Packs/day: .5    Types: Cigarettes    Quit date: 12/2019    Years since quitting: 2.8   Smokeless tobacco: Never  Vaping Use   Vaping Use: Never used  Substance Use Topics   Alcohol  use: Yes    Alcohol/week: 0.0 standard drinks of alcohol    Comment: socially   Drug use: No     Allergies   Patient has no known allergies.   Review of Systems Review of Systems Per HPI  Physical Exam Triage Vital Signs ED Triage Vitals [10/03/22 1618]  Enc Vitals Group     BP (!) 142/79     Pulse Rate 100     Resp 18     Temp 98.3 F (36.8 C)     Temp Source Oral     SpO2 97 %     Weight      Height      Head Circumference      Peak Flow      Pain Score 6     Pain Loc      Pain Edu?      Excl. in [place]?    No data found.  Updated Vital Signs BP (!) 142/79 (BP Location: Left Arm)   Pulse 100   Temp 98.3 F (36.8 C) (Oral)   Resp 18   SpO2 97%   Visual Acuity Right Eye Distance:   Left Eye Distance:   Bilateral Distance:    Right Eye Near:   Left Eye Near:    Bilateral Near:     Physical Exam Constitutional:      General: She is not in acute distress.    Appearance: Normal appearance. She is not toxic-appearing or diaphoretic.  HENT:     Head: Normocephalic and atraumatic.  Eyes:     Extraocular Movements: Extraocular movements intact.     Conjunctiva/sclera: Conjunctivae normal.  Neck:     Comments: Patient has mild tenderness to palpation to right lateral upper neck muscles.  No obvious swelling or discoloration noted.  No direct spinal tenderness, crepitus, step-off noted.  Patient has full range of motion of neck but reports pain with rotation of the neck to the left side. Pulmonary:     Effort: Pulmonary effort is normal.  Musculoskeletal:     Cervical back: Pain with movement and muscular tenderness present. No spinous process tenderness.  Neurological:     General: No focal deficit present.     Mental Status: She is alert and oriented to person, place, and time. Mental status is at baseline.  Psychiatric:        Mood and Affect: Mood normal.        Behavior: Behavior normal.        Thought Content: Thought content normal.         Judgment: Judgment normal.      UC Treatments / Results  Labs (all labs ordered are listed, but only abnormal results are displayed) Labs Reviewed - No data to display  EKG   Radiology No results found.  Procedures Procedures (including critical Daugherty time)  Medications Ordered in UC Medications - No data to display  Initial Impression / Assessment and Plan / UC Course  I have reviewed the triage vital signs and the nursing notes.  Pertinent labs & imaging results that were available during my Daugherty of the patient were reviewed by me and considered in my medical decision making (see chart for details).     Physical exam is consistent with neck muscle strain.  Will treat with muscle relaxer as patient has taken this before and tolerated well.  Given no injury or direct spinal tenderness, will defer imaging.  Reminded patient that it can make her drowsy so no driving or drinking alcohol with it.  She reports that she does take gabapentin so encouraged her to take these medications separately as it cause increased drowsiness.  She voiced understanding of this.  Advised supportive Daugherty and symptom management.  Advised following up if any symptoms persist or worsen and patient verbalized understanding and was agreeable with plan. Final Clinical Impressions(s) / UC Diagnoses   Final diagnoses:  Strain of neck muscle, initial encounter     Discharge Instructions      It appears that have been neck muscle strain.  I have prescribed a muscle relaxer to take as needed.  Please be advised that it can make you drowsy so no driving or drinking alcohol with it.  Follow-up if any symptoms persist or worsen.    ED Prescriptions     Medication Sig Dispense Auth. Provider   methocarbamol (ROBAXIN) 500 MG tablet Take 1 tablet (500 mg total) by mouth 2 (two) times daily as needed for muscle spasms. 20 tablet Goehner, Acie Fredrickson, [place]      PDMP not reviewed this encounter.   Gustavus Bryant,  [place] 10/03/22 [phone]

## 2022-12-11 ENCOUNTER — Ambulatory Visit
Admission: EM | Admit: 2022-12-11 | Discharge: 2022-12-11 | Disposition: A | Discharge status: Home / Self Care | Payer: BC Managed Care – PPO

## 2022-12-11 DIAGNOSIS — H65192 Other acute nonsuppurative otitis media, left ear: Secondary | ICD-10-CM

## 2022-12-11 DIAGNOSIS — G4733 Obstructive sleep apnea (adult) (pediatric): Secondary | ICD-10-CM | POA: Insufficient documentation

## 2022-12-11 DIAGNOSIS — K5730 Diverticulosis of large intestine without perforation or abscess without bleeding: Secondary | ICD-10-CM | POA: Insufficient documentation

## 2022-12-11 DIAGNOSIS — Z1211 Encounter for screening for malignant neoplasm of colon: Secondary | ICD-10-CM | POA: Insufficient documentation

## 2022-12-11 DIAGNOSIS — R1310 Dysphagia, unspecified: Secondary | ICD-10-CM | POA: Insufficient documentation

## 2022-12-11 MED ORDER — FLUTICASONE PROPIONATE 50 MCG/ACT NA SUSP
1.0000 | Freq: Every day | NASAL | 0 refills | Status: DC
Start: 2022-12-11 — End: 2023-10-09

## 2022-12-11 MED ORDER — IBUPROFEN 800 MG PO TABS
800.0000 mg | ORAL_TABLET | Freq: Three times a day (TID) | ORAL | 0 refills | Status: DC
Start: 2022-12-11 — End: 2023-01-11

## 2022-12-11 MED ORDER — AMOXICILLIN-POT CLAVULANATE 875-125 MG PO TABS: 1.0000 | ORAL_TABLET | Freq: Two times a day (BID) | ORAL | 0 refills | Status: DC

## 2022-12-11 NOTE — ED Triage Notes (Signed)
"  I am having a left ear ache and sometimes when I turn my head the pain shoots down my beck on my left side". "This has been about 3 days now". No fever. No injury.

## 2022-12-11 NOTE — ED Provider Notes (Signed)
[name]    CSN: [id number] Arrival date & time: 12/11/22  1553      History   Chief Complaint Chief Complaint  Patient presents with   Otalgia    HPI Jaime Daugherty is a 52 y.o. female.   Patient here today for evaluation of left ear pain that radiates down her neck at times.  She states that symptoms have been ongoing for 3 days and have worsened with time.  She denies any fever.  She has not had any injury.  She does report having runny nose prior to this.  She has tried eardrops without resolution.  The history is provided by the patient.  Otalgia Associated symptoms: no abdominal pain, no congestion, no cough, no fever, no sore throat and no vomiting     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   Colon cancer screening 12/11/2022   Influenza A 03/31/2021   Acute respiratory failure with hypoxia (HCC) 03/31/2021   Acute diarrhea 03/31/2021   Hypokalemia due to excessive gastrointestinal loss of potassium 03/31/2021   Mixed hyperlipidemia due to type 2 diabetes mellitus (HCC) 03/31/2021   Gastroesophageal reflux disease 03/31/2021   Lactic acidosis 03/31/2021   Elevated brain natriuretic peptide (BNP) level 03/31/2021   Sepsis, viral (HCC) 03/31/2021   Uncontrolled type 2 diabetes mellitus with hyperglycemia (HCC) 08/23/2018   Morbid obesity (HCC) 08/23/2018   Chest pain of uncertain etiology 08/22/2018   Type 2 diabetes mellitus with hyperglycemia, with long-term current use of insulin (HCC) 08/26/2013   Essential hypertension 06/25/2011    Past Surgical History:  Procedure Laterality Date   ABDOMINAL HYSTERECTOMY     CHOLECYSTECTOMY      OB History   No  obstetric history on file.      Home Medications    Prior to Admission medications   Medication Sig Start Date End Date Taking? Authorizing Provider  amoxicillin-clavulanate (AUGMENTIN) 875-125 MG tablet Take 1 tablet by mouth every 12 (twelve) hours. 12/11/22  Yes Tomi Bamberger, PA-C  augmented betamethasone dipropionate (DIPROLENE-AF) 0.05 % cream Apply 1 Application topically 2 (two) times daily. 07/31/22  Yes [provider]  clobetasol cream (TEMOVATE) 0.05 % Apply 1 Application topically 2 (two) times daily as needed. 09/20/22  Yes [provider]  clobetasol ointment (TEMOVATE) 0.05 % Apply 1 Application topically 2 (two) times daily as needed. 07/12/22  Yes [provider]  fluticasone (FLONASE) 50 MCG/ACT nasal spray Place 1 spray into both nostrils daily. 12/11/22  Yes Tomi Bamberger, PA-C  furosemide (LASIX) 40 MG tablet Take 40 mg by mouth every other day. 11/02/17  Yes [provider]  gabapentin (NEURONTIN) 100 MG capsule Take 100 mg by mouth 3 (three) times daily. 09/27/22  Yes [provider]  GAVILYTE-G 236 g solution Take 4,000 mLs by mouth once. 06/27/22  Yes [provider]  hydroquinone 4 % cream Apply 1 Application topically 2 (two) times daily. 07/12/22  Yes [provider]  hydrOXYzine (ATARAX) 25 MG tablet Take 25 mg by mouth 3 (three) times daily as needed. 08/22/22  Yes [provider]  ibuprofen (ADVIL) 800 MG tablet Take 1 tablet (800 mg total) by mouth 3 (  three) times daily. 12/11/22  Yes Tomi Bamberger, PA-C  lisinopril-hydrochlorothiazide (ZESTORETIC) 20-12.5 MG tablet Take 2 tablets by mouth daily.   Yes [provider]  MOUNJARO 10 MG/0.5ML Pen Inject 10 mg into the skin once a week.   Yes [provider]  MOUNJARO 7.5 MG/0.5ML Pen Inject 7.5 mg into the skin once a week. 10/02/22  Yes [provider]  ONETOUCH VERIO test strip 1 each by Other route 3 (three) times  daily. as directed 07/09/22  Yes [provider]  acetaminophen (TYLENOL) 500 MG tablet Take 500 mg by mouth every 6 (six) hours as needed for headache or mild pain.    [provider]  albuterol (PROVENTIL HFA;VENTOLIN HFA) 108 (90 Base) MCG/ACT inhaler Inhale 2 puffs into the lungs every 6 (six) hours as needed for wheezing or shortness of breath. 02/06/18   Elvina Sidle, MD  empagliflozin (JARDIANCE) 25 MG TABS tablet Take 1 tablet by mouth daily.    [provider]  Insulin Glargine (LANTUS SOLOSTAR) 100 UNIT/ML Solostar Pen Inject 30 Units into the skin daily. Patient taking differently: Inject 60 Units into the skin daily. 01/04/17   Romero Belling, MD  insulin glargine (LANTUS SOLOSTAR) 100 UNIT/ML Solostar Pen 40 units Subcutaneous Once a day 01/04/17   [provider]  lisinopril-hydrochlorothiazide (ZESTORETIC) 20-12.5 MG tablet 1 tablet Orally Once a day for 90 days 11/24/21   [provider]  methocarbamol (ROBAXIN) 500 MG tablet Take 1 tablet (500 mg total) by mouth 2 (two) times daily as needed for muscle spasms. 10/03/22   [place], Acie Fredrickson, FNP  MOUNJARO 7.5 MG/0.5ML Pen Inject 7.5 mg into the skin once a week.    [provider]  rosuvastatin (CRESTOR) 20 MG tablet Take 1 tablet (20 mg total) by mouth at bedtime. 08/22/18 08/22/19  Patwardhan, Anabel Bene, MD  Semaglutide-Weight Management (WEGOVY) 1.7 MG/0.75ML SOAJ 1.7 mg Subcutaneous Once a week    [provider]  sucralfate (CARAFATE) 1 GM/10ML suspension Take 10 mLs by mouth 4 (four) times daily. TAKE 10 MLS BY MOUTH FOUR TIMES DAILY BETWEEN MEALS AND AT BEDTIME for 30    [provider]  triamcinolone cream (KENALOG) 0.1 % Apply 1 Application topically 2 (two) times daily. To affected area till better 05/24/22   Zenia Resides, MD    Family History Family History  Problem Relation Age of Onset   Hypertension Mother    Hypertension Sister    Diabetes Paternal  Grandmother    Breast cancer Maternal Aunt     Social History Social History   Tobacco Use   Smoking status: Former    Current packs/day: 0.00    Types: Cigarettes    Quit date: 12/2019    Years since quitting: 3.0   Smokeless tobacco: Never  Vaping Use   Vaping status: Never Used  Substance Use Topics   Alcohol use: Yes    Alcohol/week: 0.0 standard drinks of alcohol    Comment: socially   Drug use: No     Allergies   Dapagliflozin and Metformin hcl   Review of Systems Review of Systems  Constitutional:  Negative for chills and fever.  HENT:  Positive for ear pain. Negative for congestion and sore throat.   Eyes:  Negative for discharge and redness.  Respiratory:  Negative for cough and shortness of breath.   Gastrointestinal:  Negative for abdominal pain, nausea and vomiting.     Physical Exam Triage Vital Signs ED Triage  Vitals  Encounter Vitals Group     BP 12/11/22 1704 134/70     Systolic BP Percentile --      Diastolic BP Percentile --      Pulse Rate 12/11/22 1704 90     Resp 12/11/22 1704 18     Temp 12/11/22 1704 98.2 F (36.8 C)     Temp Source 12/11/22 1704 Oral     SpO2 12/11/22 1704 97 %     Weight 12/11/22 1700 218 lb (98.9 kg)     Height 12/11/22 1700 5\' 5"  (1.651 m)     Head Circumference --      Peak Flow --      Pain Score 12/11/22 1658 6     Pain Loc --      Pain Education --      Exclude from Growth Chart --    No data found.  Updated Vital Signs BP 134/70 (BP Location: Left Arm)   Pulse 90   Temp 98.2 F (36.8 C) (Oral)   Resp 18   Ht 5\' 5"  (1.651 m)   Wt 218 lb (98.9 kg)   SpO2 97%   BMI 36.28 kg/m      Physical Exam Vitals and nursing note reviewed.  Constitutional:      General: She is not in acute distress.    Appearance: Normal appearance. She is not ill-appearing.  HENT:     Head: Normocephalic and atraumatic.     Right Ear: Tympanic membrane normal.     Ears:     Comments: Left TM bulging and  erythematous    Nose: Nose normal. No congestion or rhinorrhea.     Mouth/Throat:     Mouth: Mucous membranes are moist.     Pharynx: Oropharynx is clear. No oropharyngeal exudate or posterior oropharyngeal erythema.  Eyes:     Conjunctiva/sclera: Conjunctivae normal.  Cardiovascular:     Rate and Rhythm: Normal rate.  Pulmonary:     Effort: Pulmonary effort is normal. No respiratory distress.  Neurological:     Mental Status: She is alert.  Psychiatric:        Mood and Affect: Mood normal.        Behavior: Behavior normal.        Thought Content: Thought content normal.      UC Treatments / Results  Labs (all labs ordered are listed, but only abnormal results are displayed) Labs Reviewed - No data to display  EKG   Radiology No results found.  Procedures Procedures (including critical care time)  Medications Ordered in UC Medications - No data to display  Initial Impression / Assessment and Plan / UC Course  I have reviewed the triage vital signs and the nursing notes.  Pertinent labs & imaging results that were available during my care of the patient were reviewed by me and considered in my medical decision making (see chart for details).    Augmentin prescribed to cover otitis media and Flonase prescribed to hopefully improve suspected eustachian tube dysfunction symptoms given pain in neck.  Ibuprofen prescribed for pain relief and recommended she take with food to avoid gastric lining issues.  Patient expressed understanding.  Encouraged follow-up if no gradual improvement with any worsening.  Final Clinical Impressions(s) / UC Diagnoses   Final diagnoses:  Other acute nonsuppurative otitis media of left ear, recurrence not specified   Discharge Instructions   None    ED Prescriptions     Medication Sig Dispense Auth.  Provider   amoxicillin-clavulanate (AUGMENTIN) 875-125 MG tablet Take 1 tablet by mouth every 12 (twelve) hours. 14 tablet Erma Pinto  F, PA-C   fluticasone [facility]) 50 MCG/ACT nasal spray Place 1 spray into both nostrils daily. 15.8 mL Tomi Bamberger, PA-C   ibuprofen (ADVIL) 800 MG tablet Take 1 tablet (800 mg total) by mouth 3 (three) times daily. 21 tablet Tomi Bamberger, PA-C      PDMP not reviewed this encounter.   Tomi Bamberger, PA-C 12/11/22 1900

## 2023-01-11 ENCOUNTER — Ambulatory Visit
Admission: EM | Admit: 2023-01-11 | Discharge: 2023-01-11 | Disposition: A | Discharge status: Home / Self Care | Payer: BC Managed Care – PPO

## 2023-01-11 ENCOUNTER — Ambulatory Visit: Payer: BC Managed Care – PPO

## 2023-01-11 DIAGNOSIS — M79671 Pain in right foot: Secondary | ICD-10-CM | POA: Diagnosis not present

## 2023-01-11 DIAGNOSIS — H65192 Other acute nonsuppurative otitis media, left ear: Secondary | ICD-10-CM

## 2023-01-11 MED ORDER — AMOXICILLIN-POT CLAVULANATE 875-125 MG PO TABS
1.0000 | ORAL_TABLET | Freq: Two times a day (BID) | ORAL | 0 refills | Status: DC
Start: 2023-01-11 — End: 2023-05-08

## 2023-01-11 MED ORDER — IBUPROFEN 800 MG PO TABS
800.0000 mg | ORAL_TABLET | Freq: Three times a day (TID) | ORAL | 0 refills | Status: DC
Start: 2023-01-11 — End: 2023-05-08

## 2023-01-11 NOTE — ED Triage Notes (Signed)
"  I am here for Left ear infection, I was here for this not too long ago and I think it is back". Last Visit: 12-11-2022. No runny nose. No cough. No drainage.   "This morning I was walking real fast and twisted the outside of my right foot hurting my foot". No ankle pain. "Just having pain shoot up my foot/leg".

## 2023-01-22 NOTE — ED Provider Notes (Signed)
[name]    CSN: [id number] Arrival date & time: 01/11/23  1607      History   Chief Complaint Chief Complaint  Patient presents with   Foot Pain    Right   Ear Pain    HPI Jaime Daugherty is a 52 y.o. female.   Patient here today for evaluation fo left ear pain. She reports she was seen for same about a month ago. She has not had fever. She denies any cough. She has tried OTC meds without resolution.   She also reports right foot pain that started after she twisted her right foot while walking rast this morning. She denies any ankle pain. She reports pain has persisted through the day.   The history is provided by the patient.  Foot Pain Pertinent negatives include no abdominal pain and no shortness of breath.    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   Colon cancer screening 12/11/2022   Influenza A 03/31/2021   Acute respiratory failure with hypoxia (HCC) 03/31/2021   Acute diarrhea 03/31/2021   Hypokalemia due to excessive gastrointestinal loss of potassium 03/31/2021   Mixed hyperlipidemia due to type 2 diabetes mellitus (HCC) 03/31/2021   Gastroesophageal reflux disease 03/31/2021   Lactic acidosis 03/31/2021   Elevated brain natriuretic peptide (BNP) level 03/31/2021   Sepsis, viral (HCC) 03/31/2021   Uncontrolled type 2 diabetes mellitus with hyperglycemia (HCC) 08/23/2018   Morbid obesity (HCC) 08/23/2018   Chest pain of uncertain etiology 08/22/2018   Type 2 diabetes mellitus with hyperglycemia, with long-term current use of insulin (HCC) 08/26/2013   Essential hypertension 06/25/2011    Past Surgical History:  Procedure Laterality Date   ABDOMINAL  HYSTERECTOMY     CHOLECYSTECTOMY      OB History   No obstetric history on file.      Home Medications    Prior to Admission medications   Medication Sig Start Date End Date Taking? Authorizing Provider  amoxicillin-clavulanate (AUGMENTIN) 875-125 MG tablet Take 1 tablet by mouth every 12 (twelve) hours. 01/11/23  Yes Tomi Bamberger, PA-C  furosemide (LASIX) 40 MG tablet Take 40 mg by mouth every other day. 11/02/17  Yes [provider]  ibuprofen (ADVIL) 800 MG tablet Take 1 tablet (800 mg total) by mouth 3 (three) times daily. 01/11/23  Yes Tomi Bamberger, PA-C  meloxicam (MOBIC) 15 MG tablet Take 15 mg by mouth daily. 12/21/22  Yes [provider]  MOUNJARO 10 MG/0.5ML Pen Inject 10 mg into the skin once a week.   Yes [provider]  acetaminophen (TYLENOL) 500 MG tablet Take 500 mg by mouth every 6 (six) hours as needed for headache or mild pain.    [provider]  albuterol (PROVENTIL HFA;VENTOLIN HFA) 108 (90 Base) MCG/ACT inhaler Inhale 2 puffs into the lungs every 6 (six) hours as needed for wheezing or shortness of breath. 02/06/18   Elvina Sidle, MD  augmented betamethasone dipropionate (DIPROLENE-AF) 0.05 % cream Apply 1 Application topically 2 (two) times daily. 07/31/22   [provider]  clobetasol cream (TEMOVATE) 0.05 % Apply 1 Application topically 2 (two) times daily as needed. 09/20/22   [provider]  clobetasol ointment (TEMOVATE) 0.05 % Apply 1 Application topically 2 (two) times daily as needed. 07/12/22   [provider]  empagliflozin (JARDIANCE) 25 MG TABS tablet Take 1 tablet by mouth daily.    [provider]  fluticasone (FLONASE) 50 MCG/ACT nasal spray Place 1 spray into both nostrils daily. 12/11/22   Tomi Bamberger, PA-C  FUROSEMIDE PO Furosemide    [provider]  gabapentin (NEURONTIN) 100 MG capsule Take 100 mg by mouth 3 (three) times daily. 09/27/22   [provider]  GAVILYTE-G 236 g solution Take 4,000 mLs by mouth once. 06/27/22   [provider]  hydroquinone 4 % cream Apply 1 Application topically 2 (two) times daily. 07/12/22   [provider]  hydrOXYzine (ATARAX) 25 MG tablet Take 25 mg by mouth 3 (three) times daily as needed. 08/22/22   [provider]  Insulin Glargine (LANTUS [place]) Lantus    [provider]  Insulin Glargine (LANTUS [place]) Lantus    [provider]  Insulin Glargine (LANTUS SOLOSTAR) 100 UNIT/ML Solostar Pen Inject 30 Units into the skin daily. Patient taking differently: Inject 60 Units into the skin daily. 01/04/17   Romero Belling, MD  insulin glargine (LANTUS SOLOSTAR) 100 UNIT/ML Solostar Pen 40 units Subcutaneous Once a day 01/04/17   [provider]  Liraglutide (VICTOZA [place]) Victoza    [provider]  LISINOPRIL PO Lisinopril    [provider]  lisinopril-hydrochlorothiazide (ZESTORETIC) 20-12.5 MG tablet Take 2 tablets by mouth daily.    [provider]  lisinopril-hydrochlorothiazide (ZESTORETIC) 20-12.5 MG tablet 1 tablet Orally Once a day for 90 days 11/24/21   [provider]  methocarbamol (ROBAXIN) 500 MG tablet Take 1 tablet (500 mg total) by mouth 2 (two) times daily as needed for muscle spasms. 10/03/22   Gustavus Bryant, FNP  [facility] VERIO test strip 1 each by Other route 3 (three) times daily. as directed 07/09/22   [provider]  rosuvastatin (CRESTOR) 20 MG tablet Take 1 tablet (20 mg total) by mouth at bedtime. 08/22/18 08/22/19  Patwardhan, Anabel Bene, MD  Semaglutide-Weight Management (WEGOVY ) Wegovy (1.7 mg dose)    [provider]  Semaglutide-Weight Management (WEGOVY) 1.7 MG/0.75ML SOAJ 1.7 mg Subcutaneous Once a week    [provider]  sucralfate (CARAFATE) 1 GM/10ML suspension Take 10 mLs by mouth 4 (four) times daily. TAKE 10 MLS BY MOUTH FOUR TIMES DAILY BETWEEN MEALS AND AT BEDTIME  for 30    [provider]  triamcinolone cream (KENALOG) 0.1 % Apply 1 Application topically 2 (two) times daily. To affected area till better 05/24/22   Zenia Resides, MD    Family History Family History  Problem Relation Age of Onset   Hypertension Mother    Hypertension Sister    Diabetes Paternal Grandmother    Breast cancer Maternal Aunt     Social History Social History   Tobacco Use   Smoking status: Former    Current packs/day: 0.00    Types: Cigarettes    Quit date: 12/2019    Years since quitting: 3.1   Smokeless tobacco: Never  Vaping Use   Vaping status: Never Used  Substance Use Topics   Alcohol use: Yes    Alcohol/week: 0.0 standard drinks of alcohol    Comment: socially   Drug use: No     Allergies   Dapagliflozin and Metformin hcl   Review of Systems Review of Systems  Constitutional:  Negative for chills and fever.  HENT:  Positive for ear pain. Negative for congestion and sore throat.   Eyes:  Negative for discharge and redness.  Respiratory:  Negative for cough and shortness of breath.   Gastrointestinal:  Negative for abdominal pain, nausea and vomiting.  Musculoskeletal:  Positive for arthralgias.  Neurological:  Negative for numbness.     Physical Exam Triage Vital Signs ED Triage Vitals  Encounter Vitals Group     BP 01/11/23 1617 123/75     Systolic BP Percentile --      Diastolic BP Percentile --      Pulse Rate 01/11/23 1617 100     Resp 01/11/23 1617 18     Temp 01/11/23 1617 98.2 F (36.8 C)     Temp Source 01/11/23 1617 Oral     SpO2 01/11/23 1617 98 %     Weight 01/11/23 1615 214 lb (97.1 kg)     Height 01/11/23 1615 5\' 5"  (1.651 m)     Head Circumference --      Peak Flow --      Pain Score 01/11/23 1612 8     Pain Loc --      Pain Education --      Exclude from Growth Chart --    No data found.  Updated Vital Signs BP 123/75 (BP Location: Right Arm)   Pulse 100   Temp 98.2 F (36.8 C) (Oral)    Resp 18   Ht 5\' 5"  (1.651 m)   Wt 214 lb (97.1 kg)   SpO2 98%   BMI 35.61 kg/m   Visual Acuity Right Eye Distance:   Left Eye Distance:   Bilateral Distance:    Right Eye Near:   Left Eye Near:    Bilateral Near:     Physical Exam Vitals and nursing note reviewed.  Constitutional:      General: She is not in acute distress.    Appearance: Normal appearance. She is not ill-appearing.  HENT:     Head: Normocephalic and atraumatic.     Right Ear: Tympanic membrane normal.     Ears:     Comments: Left TM erythematous    Nose: Nose normal. No congestion or rhinorrhea.     Mouth/Throat:     Mouth: Mucous membranes are moist.     Pharynx: Oropharynx is clear.  Eyes:     Conjunctiva/sclera: Conjunctivae normal.  Cardiovascular:     Rate and Rhythm: Normal rate.  Pulmonary:     Effort: Pulmonary effort is normal. No respiratory distress.  Musculoskeletal:     Comments: Normal ROM of right ankle  Neurological:     Mental Status: She is alert.  Psychiatric:        Mood and Affect: Mood normal.        Behavior: Behavior normal.        Thought Content: Thought content normal.      UC Treatments / Results  Labs (all labs ordered are listed, but only abnormal results are displayed) Labs Reviewed - No data to display  EKG   Radiology No results found.  Procedures Procedures (including critical care time)  Medications Ordered in UC Medications - No data to display  Initial Impression / Assessment and Plan / UC Course  I have reviewed the triage vital signs and the nursing notes.  Pertinent labs & imaging results that were available during my care of the patient were reviewed by me and considered in my  medical decision making (see chart for details).    Will treat to cover otitis media with Augmentin. Ibuprofen prescribed for pain relief. No fracture on xray. Recommended follow up with ortho If foot pain persists.   Final Clinical Impressions(s) / UC Diagnoses    Final diagnoses:  Other acute nonsuppurative otitis media of left ear, recurrence not specified  Right foot pain   Discharge Instructions   None    ED Prescriptions     Medication Sig Dispense Auth. Provider   amoxicillin-clavulanate (AUGMENTIN) 875-125 MG tablet Take 1 tablet by mouth every 12 (twelve) hours. 14 tablet Erma Pinto F, PA-C   ibuprofen (ADVIL) 800 MG tablet Take 1 tablet (800 mg total) by mouth 3 (three) times daily. 21 tablet Tomi Bamberger, PA-C      PDMP not reviewed this encounter.   Tomi Bamberger, PA-C 01/28/23 1510

## 2023-02-27 ENCOUNTER — Other Ambulatory Visit: Payer: Self-pay | Admitting: Internal Medicine

## 2023-02-27 DIAGNOSIS — Z1231 Encounter for screening mammogram for malignant neoplasm of breast: Secondary | ICD-10-CM

## 2023-03-14 ENCOUNTER — Ambulatory Visit: Payer: BC Managed Care – PPO

## 2023-04-02 ENCOUNTER — Ambulatory Visit
Admission: RE | Admit: 2023-04-02 | Discharge: 2023-04-02 | Disposition: A | Discharge status: Home / Self Care | Payer: BC Managed Care – PPO | Source: Ambulatory Visit | Attending: Internal Medicine | Admitting: Internal Medicine

## 2023-04-02 DIAGNOSIS — Z1231 Encounter for screening mammogram for malignant neoplasm of breast: Secondary | ICD-10-CM

## 2023-05-08 ENCOUNTER — Ambulatory Visit
Admission: EM | Admit: 2023-05-08 | Discharge: 2023-05-08 | Disposition: A | Discharge status: Home / Self Care | Payer: BC Managed Care – PPO

## 2023-05-08 DIAGNOSIS — J0100 Acute maxillary sinusitis, unspecified: Secondary | ICD-10-CM | POA: Diagnosis not present

## 2023-05-08 MED ORDER — AMOXICILLIN-POT CLAVULANATE 875-125 MG PO TABS
1.0000 | ORAL_TABLET | Freq: Two times a day (BID) | ORAL | 0 refills | Status: AC
Start: 2023-05-08 — End: ?

## 2023-05-08 MED ORDER — IBUPROFEN 800 MG PO TABS
800.0000 mg | ORAL_TABLET | Freq: Three times a day (TID) | ORAL | 0 refills | Status: AC
Start: 2023-05-08 — End: ?

## 2023-05-08 NOTE — ED Provider Notes (Signed)
 [name]    CSN: [id number] Arrival date & time: 05/08/23  1321      History   Chief Complaint Chief Complaint  Patient presents with   Sinus Problem   Otalgia    HPI Jaime Daugherty is a 53 y.o. female.   Patient here today for an of left-sided sinus pain and ear pain that started a few days ago.  She reports symptoms seem to be worsening.  She has not had any fever.  She denies any cough or nausea, vomiting or diarrhea.  She has taken over-the-counter medication with mild relief.  The history is provided by the patient.  Sinus Problem Pertinent negatives include no abdominal pain and no shortness of breath.  Otalgia Associated symptoms: congestion   Associated symptoms: no abdominal pain, no cough, no diarrhea, no fever, no sore throat and no vomiting     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   Colon cancer screening 12/11/2022   Influenza A 03/31/2021   Acute respiratory failure with hypoxia (HCC) 03/31/2021   Acute diarrhea 03/31/2021   Hypokalemia due to excessive gastrointestinal loss of potassium 03/31/2021   Mixed hyperlipidemia due to type 2 diabetes mellitus (HCC) 03/31/2021   Gastroesophageal reflux disease 03/31/2021   Lactic acidosis 03/31/2021   Elevated brain natriuretic peptide (BNP) level 03/31/2021   Sepsis, viral (HCC) 03/31/2021   Uncontrolled type 2 diabetes mellitus with hyperglycemia (HCC) 08/23/2018   Morbid obesity (HCC) 08/23/2018   Chest pain of uncertain etiology 08/22/2018   Type 2 diabetes mellitus with hyperglycemia, with long-term current use of insulin  (HCC) 08/26/2013   Essential hypertension 06/25/2011    Past Surgical History:   Procedure Laterality Date   ABDOMINAL HYSTERECTOMY     CHOLECYSTECTOMY      OB History   No obstetric history on file.      Home Medications    Prior to Admission medications   Medication Sig Start Date End Date Taking? Authorizing Provider  amoxicillin -clavulanate (AUGMENTIN ) 875-125 MG tablet Take 1 tablet by mouth every 12 (twelve) hours. 05/08/23  Yes Billy Asberry FALCON, PA-C  furosemide (LASIX) 40 MG tablet Take 40 mg by mouth every other day. 11/02/17  Yes [provider]  lisinopril-hydrochlorothiazide (ZESTORETIC) 20-12.5 MG tablet Take 2 tablets by mouth daily.   Yes [provider]  MOUNJARO 10 MG/0.5ML Pen Inject 10 mg into the skin once a week.   Yes [provider]  acetaminophen  (TYLENOL ) 500 MG tablet Take 500 mg by mouth every 6 (six) hours as needed for headache or mild pain.    [provider]  albuterol  (PROVENTIL  HFA;VENTOLIN  HFA) 108 (90 Base) MCG/ACT inhaler Inhale 2 puffs into the lungs every 6 (six) hours as needed for wheezing or shortness of breath. 02/06/18   Mario Million, MD  augmented betamethasone  dipropionate (DIPROLENE -AF) 0.05 % cream Apply 1 Application topically 2 (two) times daily. 07/31/22   [provider]  clobetasol cream (TEMOVATE) 0.05 % Apply 1 Application topically 2 (two) times daily as needed. 09/20/22   [provider]  clobetasol ointment (TEMOVATE) 0.05 % Apply 1 Application topically 2 (two) times daily as needed. 07/12/22   [provider]  empagliflozin (  JARDIANCE) 25 MG TABS tablet Take 1 tablet by mouth daily.    [provider]  fluconazole  (DIFLUCAN ) 150 MG tablet Take 150 mg by mouth daily as needed. 01/24/23   [provider]  fluticasone  (FLONASE ) 50 MCG/ACT nasal spray Place 1 spray into both nostrils daily. 12/11/22   Billy Asberry FALCON, PA-C  gabapentin (NEURONTIN) 100 MG capsule Take 100 mg by mouth 3 (three) times daily. 09/27/22   [provider]  GAVILYTE-G 236 g solution Take 4,000 mLs by mouth once. 06/27/22   [provider]  hydroquinone 4 % cream Apply 1 Application topically 2 (two) times daily. 07/12/22   [provider]  hydrOXYzine (ATARAX) 25 MG tablet Take 25 mg by mouth 3 (three) times daily as needed. 08/22/22   [provider]  ibuprofen  (ADVIL ) 800 MG tablet Take 1 tablet (800 mg total) by mouth 3 (three) times daily. 05/08/23   Billy Asberry FALCON, PA-C  Insulin  Glargine (LANTUS  [place]) Lantus     [provider]  Insulin  Glargine (LANTUS  [place]) Lantus     [provider]  Insulin  Glargine (LANTUS  SOLOSTAR) 100 UNIT/ML Solostar Pen Inject 30 Units into the skin daily. Patient taking differently: Inject 60 Units into the skin daily. 01/04/17   Kassie Mallick, MD  insulin  glargine (LANTUS  SOLOSTAR) 100 UNIT/ML Solostar Pen 40 units Subcutaneous Once a day 01/04/17   [provider]  Liraglutide (VICTOZA [place]) Victoza    [provider]  LISINOPRIL PO Lisinopril    [provider]  lisinopril-hydrochlorothiazide (ZESTORETIC) 20-12.5 MG tablet 1 tablet Orally Once a day for 90 days 11/24/21   [provider]  meloxicam (MOBIC) 15 MG tablet Take 15 mg by mouth daily. 12/21/22   [provider]  methocarbamol  (ROBAXIN ) 500 MG tablet Take 1 tablet (500 mg total) by mouth 2 (two) times daily as needed for muscle spasms. 10/03/22   Hazen Darryle BRAVO, FNP  [facility] VERIO test strip 1 each by Other route 3 (three) times daily. as directed 07/09/22   [provider]  PEG 3350 -KCl-NaBcb-NaCl-NaSulf (GOLYTELY  PO) See admin instructions. 06/26/22   [provider]  rosuvastatin  (CRESTOR ) 20 MG tablet Take 1 tablet (20 mg total) by mouth at bedtime. 08/22/18 08/22/19  Patwardhan, Newman PARAS, MD  Semaglutide-Weight Management (WEGOVY [place]) Wegovy (1.7 mg dose)    [provider]  Semaglutide-Weight Management (WEGOVY) 1.7 MG/0.75ML SOAJ 1.7 mg Subcutaneous  Once a week    [provider]  sucralfate (CARAFATE) 1 GM/10ML suspension Take 10 mLs by mouth 4 (four) times daily. TAKE 10 MLS BY MOUTH FOUR TIMES DAILY BETWEEN MEALS AND AT BEDTIME for 30    [provider]  triamcinolone  cream (KENALOG ) 0.1 % Apply 1 Application topically 2 (two) times daily. To affected area till better 05/24/22   Vonna Sharlet POUR, MD    Family History Family History  Problem Relation Age of Onset   Hypertension Mother    Hypertension Sister    Diabetes Paternal Grandmother    Breast cancer Maternal Aunt     Social History Social History   Tobacco Use   Smoking status: Former    Current packs/day: 0.00    Types: Cigarettes    Quit date: 12/2019    Years since quitting: 3.4   Smokeless tobacco: Never  Vaping Use   Vaping status: Never Used  Substance Use Topics   Alcohol use: Yes    Alcohol/week: 0.0 standard drinks of alcohol    Comment: socially  Drug use: No     Allergies   Dapagliflozin  and Metformin  hcl   Review of Systems Review of Systems  Constitutional:  Negative for chills and fever.  HENT:  Positive for congestion, ear pain and sinus pressure. Negative for sore throat.   Eyes:  Negative for discharge and redness.  Respiratory:  Negative for cough, shortness of breath and wheezing.   Gastrointestinal:  Negative for abdominal pain, diarrhea, nausea and vomiting.     Physical Exam Triage Vital Signs ED Triage Vitals  Encounter Vitals Group     BP 05/08/23 1411 (!) 144/81     Systolic BP Percentile --      Diastolic BP Percentile --      Pulse Rate 05/08/23 1411 90     Resp 05/08/23 1411 18     Temp 05/08/23 1411 98.4 F (36.9 C)     Temp Source 05/08/23 1411 Oral     SpO2 05/08/23 1411 97 %     Weight 05/08/23 1412 220 lb (99.8 kg)     Height 05/08/23 1412 5' 5 (1.651 m)     Head Circumference --      Peak Flow --      Pain Score 05/08/23 1411 8     Pain Loc --      Pain Education --      Exclude  from Growth Chart --    No data found.  Updated Vital Signs BP 133/79 (BP Location: Right Arm)   Pulse 84   Temp 98.4 F (36.9 C) (Oral)   Resp 18   Ht 5' 5 (1.651 m)   Wt 220 lb (99.8 kg)   SpO2 97%   BMI 36.61 kg/m   Visual Acuity Right Eye Distance:   Left Eye Distance:   Bilateral Distance:    Right Eye Near:   Left Eye Near:    Bilateral Near:     Physical Exam Vitals and nursing note reviewed.  Constitutional:      General: She is not in acute distress.    Appearance: Normal appearance. She is not ill-appearing.  HENT:     Head: Normocephalic and atraumatic.     Right Ear: Tympanic membrane normal.     Left Ear: Tympanic membrane normal.     Nose: Congestion present.     Mouth/Throat:     Mouth: Mucous membranes are moist.     Pharynx: No oropharyngeal exudate or posterior oropharyngeal erythema.  Eyes:     Conjunctiva/sclera: Conjunctivae normal.  Cardiovascular:     Rate and Rhythm: Normal rate and regular rhythm.     Heart sounds: Normal heart sounds. No murmur heard. Pulmonary:     Effort: Pulmonary effort is normal. No respiratory distress.     Breath sounds: Normal breath sounds. No wheezing, rhonchi or rales.  Skin:    General: Skin is warm and dry.  Neurological:     Mental Status: She is alert.  Psychiatric:        Mood and Affect: Mood normal.        Thought Content: Thought content normal.      UC Treatments / Results  Labs (all labs ordered are listed, but only abnormal results are displayed) Labs Reviewed - No data to display  EKG   Radiology No results found.  Procedures Procedures (including critical care time)  Medications Ordered in UC Medications - No data to display  Initial Impression / Assessment and Plan / UC Course  I have  reviewed the triage vital signs and the nursing notes.  Pertinent labs & imaging results that were available during my care of the patient were reviewed by me and considered in my medical  decision making (see chart for details).    Suspect sinusitis and will treat with Augmentin .  Ibuprofen  prescribed at patient's request.  Advised follow-up if no gradual improvement with treatment or with any further concerns.  Final Clinical Impressions(s) / UC Diagnoses   Final diagnoses:  Acute maxillary sinusitis, recurrence not specified   Discharge Instructions   None    ED Prescriptions     Medication Sig Dispense Auth. Provider   amoxicillin -clavulanate (AUGMENTIN ) 875-125 MG tablet Take 1 tablet by mouth every 12 (twelve) hours. 14 tablet Billy Stabs F, PA-C   ibuprofen  (ADVIL ) 800 MG tablet Take 1 tablet (800 mg total) by mouth 3 (three) times daily. 21 tablet Billy Stabs FALCON, PA-C      PDMP not reviewed this encounter.   Billy Stabs FALCON, PA-C 05/08/23 [phone]

## 2023-05-08 NOTE — ED Triage Notes (Signed)
"  Started with sinus ache/pain/pressure with drainage causing left ear pain". This started "a few days ago". No fever known.

## 2023-10-09 ENCOUNTER — Encounter: Payer: Self-pay | Admitting: Dermatology

## 2023-10-09 ENCOUNTER — Ambulatory Visit (INDEPENDENT_AMBULATORY_CARE_PROVIDER_SITE_OTHER): Payer: BC Managed Care – PPO | Admitting: Dermatology

## 2023-10-09 VITALS — BP 116/76 | HR 102

## 2023-10-09 DIAGNOSIS — R202 Paresthesia of skin: Secondary | ICD-10-CM

## 2023-10-09 DIAGNOSIS — L658 Other specified nonscarring hair loss: Secondary | ICD-10-CM | POA: Diagnosis not present

## 2023-10-09 DIAGNOSIS — L819 Disorder of pigmentation, unspecified: Secondary | ICD-10-CM

## 2023-10-09 DIAGNOSIS — L810 Postinflammatory hyperpigmentation: Secondary | ICD-10-CM

## 2023-10-09 DIAGNOSIS — L649 Androgenic alopecia, unspecified: Secondary | ICD-10-CM

## 2023-10-09 NOTE — Progress Notes (Signed)
   New Patient Visit   Subjective  Jaime Daugherty is a 53 y.o. female who presents for the following: Hair Loss and back irritation  Patient states she has hair concerns and irritation located at the scalp and back that she would like to have examined. Patient reports the areas have been there for 1 year. She reports the areas are bothersome. Patient reports the area is very itcvhy and if she scratches it is painful. Patient rates irritation 8 out of 10. Patient reports she has previously been treated for these areas.She was first prescribed TMC that didn't help, She was then  Patient reports Hx of bx. Her bx was completed by [facility]. She currently does not have access to the results.   Patient reports for her hair, she noticed hair thinning after starting Wegovy 1.5 years. She was switched by her Endocrinologist to [place]ouro. She states she has tried otc oils with no improvement. She reports she has previously gotten relaxers, braids, crochet and quick weaves.  The following portions of the chart were reviewed this encounter and updated as appropriate: medications, allergies, medical history  Review of Systems:  No other skin or systemic complaints except as noted in HPI or Assessment and Plan.  Objective  Well appearing patient in no apparent distress; mood and affect are within normal limits.   A focused examination was performed of the following areas: Scalp and Back  Relevant exam findings are noted in the Assessment and Plan.                       Assessment & Plan   1. 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 Initially resolved with prescribed treatments (betamethasone, clobetasol, and triamcinolone ) but has returned. Current itch severity rated at 8/10, primarily occurring at night. Previous response to gabapentin supports the diagnosis of notalgia paresthetica,  characterized by nerve irritation causing itching. - Plan:    Prescribe topical compound of gabapentin, lidocaine, and ketoprofen from compounding pharmacy    Follow up in 4 months to assess progress  2. 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 Recent use of weight loss medications (Wegovy and Zepbound) may have exacerbated hair shedding, revealing underlying hormonal hair thinning post-menopause. - Plan:    Prescribe minoxidil and finasteride drops from compounding pharmacy     - Instruct patient to use daily and wash hands after application    Recommend avoiding tight hairstyles to prevent further hair loss    Suggest supplements: Vyvosgal and collagen powder to boost hair growth    Follow up in 4 months to assess progress  3. Dark Spots (PIH) - Assessment: Patient reports dark spots, likely hyperpigmentation. - Plan:    Recommend use of Dual Active Serum with thiamine    Advise regular use of sunscreen to prevent further darkening  Follow-up in 4 months for reassessment of all conditions.    Return in about 4 months (around 02/09/2024) for Androgenetic Alopecia & Notalgia Paresthetica F/U.  I, Jetta Ager, am acting as Neurosurgeon for [employer], DO.  Documentation: I have reviewed the above documentation for accuracy and completeness, and I agree with the above.  Delon Lenis, DO

## 2023-10-09 NOTE — Patient Instructions (Addendum)
 Date: Wed Oct 09 2023  Hello Jaime Daugherty,  Thank you for visiting today. Here is a summary of the key instructions:  - Medications:   - Apply topical gabapentin, lidocaine, and ketoprofen from the compounding pharmacy to the irritated area on your back   - Use Eucerin Dual Active Serum with thiamidol for dark spots   - Apply sunscreen daily to prevent further darkening of spots   - Use minoxidil and finasteride drops from the compounding pharmacy daily for hair loss   - Take Viviscal supplement for hair growth   - Use  Vital Proteins collagen powder to support hair growth  - Instructions:   - Wash hands after applying hair loss medications   - Avoid tight hairstyles to prevent further hair loss  - Follow-up:   - Return for a follow-up appointment in 4 months to assess progress  Please reach out if you have any questions or concerns.  Warm regards,  Dr. Delon Lenis, Dermatology             Important Information   Due to recent changes in healthcare laws, you may see results of your pathology and/or laboratory studies on MyChart before the doctors have had a chance to review them. We understand that in some cases there may be results that are confusing or concerning to you. Please understand that not all results are received at the same time and often the doctors may need to interpret multiple results in order to provide you with the best plan of care or course of treatment. Therefore, we ask that you please give us  2 business days to thoroughly review all your results before contacting the office for clarification. Should we see a critical lab result, you will be contacted sooner.     If You Need Anything After Your Visit   If you have any questions or concerns for your doctor, please call our main line at [phone]. If no one answers, please leave a voicemail as directed and we will return your call as soon as possible. Messages left after 4 pm will be answered the following  business day.    You may also send us  a message via MyChart. We typically respond to MyChart messages within 1-2 business days.  For prescription refills, please ask your pharmacy to contact our office. Our fax number is [phone].  If you have an urgent issue when the clinic is closed that cannot wait until the next business day, you can page your doctor at the number below.     Please note that while we do our best to be available for urgent issues outside of office hours, we are not available 24/7.    If you have an urgent issue and are unable to reach us , you may choose to seek medical care at your doctor's office, retail clinic, urgent care center, or emergency room.   If you have a medical emergency, please immediately call 911 or go to the emergency department. In the event of inclement weather, please call our main line at [phone] for an update on the status of any delays or closures.  Dermatology Medication Tips: Please keep the boxes that topical medications come in in order to help keep track of the instructions about where and how to use these. Pharmacies typically print the medication instructions only on the boxes and not directly on the medication tubes.   If your medication is too expensive, please contact our office at [phone] or send us  a  message through MyChart.    We are unable to tell what your co-pay for medications will be in advance as this is different depending on your insurance coverage. However, we may be able to find a substitute medication at lower cost or fill out paperwork to get insurance to cover a needed medication.    If a prior authorization is required to get your medication covered by your insurance company, please allow us  1-2 business days to complete this process.   Drug prices often vary depending on where the prescription is filled and some pharmacies may offer cheaper prices.   The website www.goodrx.com contains coupons for  medications through different pharmacies. The prices here do not account for what the cost may be with help from insurance (it may be cheaper with your insurance), but the website can give you the price if you did not use any insurance.  - You can print the associated coupon and take it with your prescription to the pharmacy.  - You may also stop by our office during regular business hours and pick up a GoodRx coupon card.  - If you need your prescription sent electronically to a different pharmacy, notify our office through [facility] or by phone at [phone]

## 2023-10-13 MED ORDER — SAFETY SEAL MISCELLANEOUS MISC: 1.0000 | Freq: Two times a day (BID) | 9 refills | Status: DC

## 2023-10-14 ENCOUNTER — Other Ambulatory Visit: Payer: Self-pay

## 2023-10-14 DIAGNOSIS — R202 Paresthesia of skin: Secondary | ICD-10-CM

## 2023-10-14 DIAGNOSIS — L649 Androgenic alopecia, unspecified: Secondary | ICD-10-CM

## 2023-10-14 MED ORDER — SAFETY SEAL MISCELLANEOUS MISC: 1.0000 | Freq: Two times a day (BID) | 9 refills | Status: AC

## 2023-10-14 MED ORDER — SAFETY SEAL MISCELLANEOUS MISC: 1.0000 | Freq: Every morning | 11 refills | Status: AC

## 2023-12-25 ENCOUNTER — Telehealth: Payer: Self-pay | Admitting: Dermatology

## 2023-12-25 NOTE — Telephone Encounter (Signed)
 Patient is having a break out on her upper right shoulder blade. Current topical medication is not working. Patient is requesting a steroid medication to be prescribed. If someone can please call patient back for further assistance.

## 2023-12-26 ENCOUNTER — Other Ambulatory Visit: Payer: Self-pay

## 2023-12-26 DIAGNOSIS — L299 Pruritus, unspecified: Secondary | ICD-10-CM

## 2023-12-26 DIAGNOSIS — R202 Paresthesia of skin: Secondary | ICD-10-CM

## 2023-12-26 MED ORDER — BETAMETHASONE DIPROPIONATE 0.05 % EX OINT
TOPICAL_OINTMENT | Freq: Two times a day (BID) | CUTANEOUS | 3 refills | Status: AC
Start: 2023-12-26 — End: ?

## 2023-12-26 NOTE — Progress Notes (Signed)
 Called patient to advised Dr. Alm ok'd sending in a prescription of Betamethasone  ointment to mix with the compounded medication 2 times daily for 2 weeks then STOP the steroid ointment and just apply the compound. If the itchy sensation is still present after 2 weeks then resume the Betamethasone . Apply 2 weeks on and 2 weeks off. Patient voiced understanding.

## 2023-12-26 NOTE — Telephone Encounter (Signed)
 Please send rx for betamethasone  to be applied BID 2 weeks on, 2 weeks off until itch has stopped.  Thanks!

## 2024-02-17 ENCOUNTER — Ambulatory Visit: Admitting: Dermatology

## 2024-03-16 ENCOUNTER — Other Ambulatory Visit: Payer: Self-pay | Admitting: Internal Medicine

## 2024-03-16 DIAGNOSIS — Z1231 Encounter for screening mammogram for malignant neoplasm of breast: Secondary | ICD-10-CM

## 2024-04-17 ENCOUNTER — Ambulatory Visit
Admission: RE | Admit: 2024-04-17 | Discharge: 2024-04-17 | Disposition: A | Discharge status: Home / Self Care | Source: Ambulatory Visit | Attending: Internal Medicine | Admitting: Internal Medicine

## 2024-04-17 DIAGNOSIS — Z1231 Encounter for screening mammogram for malignant neoplasm of breast: Secondary | ICD-10-CM
